# Patient Record
Sex: Female | Born: 1995 | Race: Black or African American | Hispanic: No | Marital: Single | Smoking: Never smoker
Health system: Southern US, Community
[De-identification: ages and names within clinical notes are randomized; demographics above are authoritative.]

## PROBLEM LIST (undated history)

## (undated) DIAGNOSIS — Z789 Other specified health status: Secondary | ICD-10-CM

---

## 2019-11-26 ENCOUNTER — Inpatient Hospital Stay (HOSPITAL_COMMUNITY): Payer: Medicaid Other

## 2019-11-26 ENCOUNTER — Encounter (HOSPITAL_COMMUNITY): Payer: Self-pay | Admitting: Obstetrics and Gynecology

## 2019-11-26 ENCOUNTER — Inpatient Hospital Stay (HOSPITAL_COMMUNITY)
Admission: AD | Admit: 2019-11-26 | Discharge: 2019-11-26 | Disposition: A | Discharge status: Home / Self Care | Payer: Medicaid Other | Attending: Obstetrics and Gynecology | Admitting: Obstetrics and Gynecology

## 2019-11-26 DIAGNOSIS — O34219 Maternal care for unspecified type scar from previous cesarean delivery: Secondary | ICD-10-CM | POA: Diagnosis not present

## 2019-11-26 DIAGNOSIS — Z3A09 9 weeks gestation of pregnancy: Secondary | ICD-10-CM | POA: Insufficient documentation

## 2019-11-26 DIAGNOSIS — O208 Other hemorrhage in early pregnancy: Secondary | ICD-10-CM | POA: Diagnosis not present

## 2019-11-26 DIAGNOSIS — R109 Unspecified abdominal pain: Secondary | ICD-10-CM

## 2019-11-26 DIAGNOSIS — O23591 Infection of other part of genital tract in pregnancy, first trimester: Secondary | ICD-10-CM | POA: Insufficient documentation

## 2019-11-26 DIAGNOSIS — O26891 Other specified pregnancy related conditions, first trimester: Secondary | ICD-10-CM

## 2019-11-26 DIAGNOSIS — B9689 Other specified bacterial agents as the cause of diseases classified elsewhere: Secondary | ICD-10-CM

## 2019-11-26 HISTORY — DX: Other specified health status: Z78.9

## 2019-11-26 LAB — WET PREP, GENITAL
Sperm: NONE SEEN
Trich, Wet Prep: NONE SEEN
Yeast Wet Prep HPF POC: NONE SEEN

## 2019-11-26 LAB — URINALYSIS, ROUTINE W REFLEX MICROSCOPIC
Bacteria, UA: NONE SEEN
Bilirubin Urine: NEGATIVE
Glucose, UA: NEGATIVE mg/dL
Hgb urine dipstick: NEGATIVE
Ketones, ur: NEGATIVE mg/dL
Leukocytes,Ua: NEGATIVE
Nitrite: NEGATIVE
Protein, ur: NEGATIVE mg/dL
Specific Gravity, Urine: 1.008 (ref 1.005–1.030)
pH: 7 (ref 5.0–8.0)

## 2019-11-26 LAB — I-STAT BETA HCG BLOOD, ED (MC, WL, AP ONLY): I-stat hCG, quantitative: >2000 m[IU]/mL — ABNORMAL HIGH (ref <5)

## 2019-11-26 LAB — CBC
HCT: 35.7 % — ABNORMAL LOW (ref 36.0–46.0)
Hemoglobin: 11.9 g/dL — ABNORMAL LOW (ref 12.0–15.0)
MCH: 30.4 pg (ref 26.0–34.0)
MCHC: 33.3 g/dL (ref 30.0–36.0)
MCV: 91.3 fL (ref 80.0–100.0)
Platelets: 201 10*3/uL (ref 150–400)
RBC: 3.91 MIL/uL (ref 3.87–5.11)
RDW: 14.1 % (ref 11.5–15.5)
WBC: 6.2 10*3/uL (ref 4.0–10.5)
nRBC: 0 % (ref 0.0–0.2)

## 2019-11-26 LAB — ABO/RH: ABO/RH(D): O POS

## 2019-11-26 LAB — HCG, QUANTITATIVE, PREGNANCY: hCG, Beta Chain, Quant, S: 106890 m[IU]/mL — ABNORMAL HIGH (ref <5)

## 2019-11-26 MED ORDER — METRONIDAZOLE 0.75 % VA GEL: 1.0000 | Freq: Every day | VAGINAL | 0 refills | Status: DC

## 2019-11-26 NOTE — MAU Provider Note (Signed)
History     CSN: [id number]  Arrival date and time: 11/26/19 1334   First Provider Initiated Contact with Patient 11/26/19 2034      Chief Complaint  Patient presents with  . Abdominal Pain  . Vaginal Bleeding   Denise Fleming is a 24 y.o. G3P2002 at Unknown [place] as she is unsure of her LMP.  She reports she was taking pills, but discontinued them in November.  Patient reports she was having periods after cessation of her pills.  She presents today for Abdominal Pain and Vaginal Bleeding.  She reports the abdominal pain is intermittent cramping.  She reports she has not taken anything for the pain and that it is mild.  Patient reports that she was having vaginal bleeding, but that was last week and has since subsided.    OB History    Gravida  3   Para  2   Term      Preterm      AB      Living  2     SAB      TAB      Ectopic      Multiple      Live Births           Obstetric Comments  1st baby "premature[place], [place] 2nd baby C/S[place]         Past Medical History:  Diagnosis Date  . Medical history non-contributory     Past Surgical History:  Procedure Laterality Date  . CESAREAN SECTION      No family history on file.  Social History   Tobacco Use  . Smoking status: Never Smoker  . Smokeless tobacco: Never Used  Substance Use Topics  . Alcohol use: Never  . Drug use: Never    Allergies: No Known Allergies  No medications prior to admission.    Review of Systems  Constitutional: Negative for chills and fever.  Respiratory: Negative for cough and shortness of breath.   Gastrointestinal: Positive for abdominal pain. Negative for constipation, diarrhea, nausea and vomiting.  Genitourinary: Positive for pelvic pain (Pressure) and vaginal bleeding (None currently). Negative for difficulty urinating, dysuria and vaginal discharge.  Musculoskeletal: Positive for back pain Lambert Mody).  Neurological: Positive for headaches  (Yesterday, but none currently). Negative for dizziness and light-headedness.   Physical Exam   Blood pressure 119/72, pulse 73, temperature 98.2 F (36.8 C), temperature source Oral, resp. rate 16, height 5\' 2"  (1.575 m), weight 72 kg, SpO2 100 %.  Physical Exam Exam conducted with a chaperone present.  Constitutional:      Appearance: She is well-developed.  HENT:     Head: Normocephalic and atraumatic.  Eyes:     Conjunctiva/sclera: Conjunctivae normal.  Cardiovascular:     Rate and Rhythm: Normal rate and regular rhythm.     Heart sounds: Normal heart sounds.  Pulmonary:     Effort: Pulmonary effort is normal. No respiratory distress.     Breath sounds: Normal breath sounds.  Abdominal:     General: Bowel sounds are normal. There is no distension.     Palpations: Abdomen is soft.     Tenderness: There is abdominal tenderness in the right lower quadrant, suprapubic area and left lower quadrant.  Genitourinary:    Vagina: Vaginal discharge present.     Cervix: No cervical motion tenderness, discharge or friability.     Uterus: Enlarged. Not tender.      Comments: Speculum Exam: -Normal External Genitalia: Non tender, no  apparent discharge at introitus.  -Vaginal Vault: Pink mucosa with good rugae. Small amt thin white discharge -wet prep collected -Cervix:Pink, no lesions, cysts, or polyps.  Appears closed. No active bleeding from os-GC/CT collected -Bimanual Exam:  Uterus enlarged, but difficult to distinguish size d/t position and panus.   Skin:    General: Skin is warm and dry.     Findings: Rash (Flesh colored plaques noted on back and legs. ) present.  Neurological:     Mental Status: She is alert and oriented to person, place, and time.  Psychiatric:        Mood and Affect: Mood normal.        Behavior: Behavior normal.     MAU Course  Procedures Results for orders placed or performed during the hospital encounter of 11/26/19 (from the past 24 hour(s))  I-Stat  Beta hCG blood, ED ([facility], [facility], [facility] only)     Status: Abnormal   Collection Time: 11/26/19  2:50 PM  Result Value Ref Range   I-stat hCG, quantitative >2,000.0 (H) <5 mIU/mL   Comment 3          Wet prep, genital     Status: Abnormal   Collection Time: 11/26/19  8:48 PM  Result Value Ref Range   Yeast Wet Prep HPF POC NONE SEEN NONE SEEN   Trich, Wet Prep NONE SEEN NONE SEEN   Clue Cells Wet Prep HPF POC PRESENT (A) NONE SEEN   WBC, Wet Prep HPF POC MANY (A) NONE SEEN   Sperm NONE SEEN   hCG, quantitative, pregnancy     Status: Abnormal   Collection Time: 11/26/19  8:52 PM  Result Value Ref Range   hCG, Beta Chain, Quant, S 106,890 (H) <5 mIU/mL  ABO/Rh     Status: None   Collection Time: 11/26/19  8:52 PM  Result Value Ref Range   ABO/RH(D) O POS    No rh immune globuloin      NOT A RH IMMUNE GLOBULIN CANDIDATE, PT RH POSITIVE Performed at [facility], [address] [place], [place] [address]   CBC     Status: Abnormal   Collection Time: 11/26/19  8:52 PM  Result Value Ref Range   WBC 6.2 4.0 - 10.5 K/uL   RBC 3.91 3.87 - 5.11 MIL/uL   Hemoglobin 11.9 (L) 12.0 - 15.0 g/dL   HCT 21.3 (L) 36 - 46 %   MCV 91.3 80.0 - 100.0 fL   MCH 30.4 26.0 - 34.0 pg   MCHC 33.3 30.0 - 36.0 g/dL   RDW 08.6 57.8 - 46.9 %   Platelets 201 150 - 400 K/uL   nRBC 0.0 0.0 - 0.2 %   US OB Comp Less 14 Wks  Result Date: 11/26/2019 CLINICAL DATA:  Abdominal pain, spotting EXAM: OBSTETRIC <14 WK ULTRASOUND TECHNIQUE: Transabdominal ultrasound was performed for evaluation of the gestation as well as the maternal uterus and adnexal regions. COMPARISON:  None. FINDINGS: Intrauterine gestational sac: Present, single Yolk sac:  Present, single, normal-appearing Embryo:  Present, single Cardiac Activity: Present, regular Heart Rate: 173 bpm MSD: Appropriate for fetal size CRL:   25.4 mm   9 w 1 d                  [place] EDC: 06/29/2020 Subchorionic hemorrhage: A small subchorionic hemorrhage is identified  comprising less than 25% of the chorionic surface. Maternal uterus/adnexae: The uterus is anteverted. No intrauterine masses are seen. The cervix  is closed though not optimally assessed on this examination due to transabdominal technique. No free fluid within the cul-de-sac. The maternal ovaries are normal in size and echogenicity. No adnexal masses are seen. IMPRESSION: Single living intrauterine gestation with an estimated gestational age of [redacted] weeks, 1 day. Small subchorionic hemorrhage. Electronically Signed   By: Helyn Numbers MD   On: 11/26/2019 21:38    MDM Pelvic Exam; Wet Prep and GC/CT Labs: UA, UPT, CBC, hCG, ABO Ultrasound Assessment and Plan  24 year old [id number] at Unknown GA Abdominal Pain Vaginal Bleeding-Subsided  -POC reviewed. -Exam performed and findings discussed. -Cultures collected and pending.  -Labs ordered -Patient offered and declines pain medication. -Will send for [place].   Cherre Robins 11/26/2019, 8:34 PM   Reassessment (10:18 PM) SIUP at 9.1 weeks Bacterial Vaginosis  -Reviewed [place] and lab findings. -Informed of GA of 9.1 weeks with EDC of 06/29/2020 -Discussed bacterial vagonisis including diagnosis and treatment. -Informed that due to abdominal pain would treat. -Patient verbalized understanding and without further q/c. -Instructed to establish [facility]. -Given written script for metronidazole gel.  -Encouraged to call or return to MAU if symptoms worsen or with the onset of new symptoms. -Discharged to home in stable condition.  Cherre Robins MSN, CNM Advanced Practice Provider, Center for [employer]

## 2019-11-26 NOTE — MAU Note (Signed)
Pt reports to MAU stating last week she noticed some spotting when she went to the [facility]. Pt states no bleeding this week. Pt reports she is having some back pain and lower abdominal pain. Pt is unsure of LMP.

## 2019-11-26 NOTE — ED Triage Notes (Signed)
Pt arrives POV for eval of vag bleeding, abd pain and +preg test. Pt reports LMP mid may. Pos preg test last week, has not f/u w/ provider yet.

## 2019-11-26 NOTE — Discharge Instructions (Signed)
Bacterial Vaginosis  Bacterial vaginosis is a vaginal infection that occurs when the normal balance of bacteria in the vagina is disrupted. It results from an overgrowth of certain bacteria. This is the most common vaginal infection among women ages 15-44. Because bacterial vaginosis increases your risk for STIs (sexually transmitted infections), getting treated can help reduce your risk for chlamydia, gonorrhea, herpes, and HIV (human immunodeficiency virus). Treatment is also important for preventing complications in pregnant women, because this condition can cause an early (premature) delivery. What are the causes? This condition is caused by an increase in harmful bacteria that are normally present in small amounts in the vagina. However, the reason that the condition develops is not fully understood. What increases the risk? The following factors may make you more likely to develop this condition:  Having a new sexual partner or multiple sexual partners.  Having unprotected sex.  Douching.  Having an intrauterine device (IUD).  Smoking.  Drug and alcohol abuse.  Taking certain antibiotic medicines.  Being pregnant. You cannot get bacterial vaginosis from toilet seats, bedding, swimming pools, or contact with objects around you. What are the signs or symptoms? Symptoms of this condition include:  Grey or white vaginal discharge. The discharge can also be watery or foamy.  A fish-like odor with discharge, especially after sexual intercourse or during menstruation.  Itching in and around the vagina.  Burning or pain with urination. Some women with bacterial vaginosis have no signs or symptoms. How is this diagnosed? This condition is diagnosed based on:  Your medical history.  A physical exam of the vagina.  Testing a sample of vaginal fluid under a microscope to look for a large amount of bad bacteria or abnormal cells. Your health care provider may use a cotton swab or  a small wooden spatula to collect the sample. How is this treated? This condition is treated with antibiotics. These may be given as a pill, a vaginal cream, or a medicine that is put into the vagina (suppository). If the condition comes back after treatment, a second round of antibiotics may be needed. Follow these instructions at home: Medicines  Take over-the-counter and prescription medicines only as told by your health care provider.  Take or use your antibiotic as told by your health care provider. Do not stop taking or using the antibiotic even if you start to feel better. General instructions  If you have a female sexual partner, tell her that you have a vaginal infection. She should see her health care provider and be treated if she has symptoms. If you have a female sexual partner, he does not need treatment.  During treatment: ? Avoid sexual activity until you finish treatment. ? Do not douche. ? Avoid alcohol as directed by your health care provider. ? Avoid breastfeeding as directed by your health care provider.  Drink enough water and fluids to keep your urine clear or pale yellow.  Keep the area around your vagina and rectum clean. ? Wash the area daily with warm water. ? Wipe yourself from front to back after using the toilet.  Keep all follow-up visits as told by your health care provider. This is important. How is this prevented?  Do not douche.  Wash the outside of your vagina with warm water only.  Use protection when having sex. This includes latex condoms and dental dams.  Limit how many sexual partners you have. To help prevent bacterial vaginosis, it is best to have sex with just one partner (  monogamous).  Make sure you and your sexual partner are tested for STIs.  Wear cotton or cotton-lined underwear.  Avoid wearing tight pants and pantyhose, especially during summer.  Limit the amount of alcohol that you drink.  Do not use any products that contain  nicotine or tobacco, such as cigarettes and e-cigarettes. If you need help quitting, ask your health care provider.  Do not use illegal drugs. Where to find more information  Centers for Disease Control and Prevention: SolutionApps.co.za  American Sexual Health Association (ASHA): www.ashastd.org  U.S. Department of Health and Health and safety inspector, Office on Women's Health: ConventionalMedicines.si or http://www.anderson-williamson.info/ Contact a health care provider if:  Your symptoms do not improve, even after treatment.  You have more discharge or pain when urinating.  You have a fever.  You have pain in your abdomen.  You have pain during sex.  You have vaginal bleeding between periods. Summary  Bacterial vaginosis is a vaginal infection that occurs when the normal balance of bacteria in the vagina is disrupted.  Because bacterial vaginosis increases your risk for STIs (sexually transmitted infections), getting treated can help reduce your risk for chlamydia, gonorrhea, herpes, and HIV (human immunodeficiency virus). Treatment is also important for preventing complications in pregnant women, because the condition can cause an early (premature) delivery.  This condition is treated with antibiotic medicines. These may be given as a pill, a vaginal cream, or a medicine that is put into the vagina (suppository). This information is not intended to replace advice given to you by your health care provider. Make sure you discuss any questions you have with your health care provider. Document Revised: 03/14/2017 Document Reviewed: 12/16/2015 Elsevier Patient Education  2020 Elsevier Inc.  [facility] Prenatal Care Providers   Center for [employer] [facility] at [facility]       Phone: [phone]  Center for [facility] at [place] Phone: [phone]  Center for [employer] at [place]  Phone: [phone]  Center for [facility]  Healthcare at [facility]  Phone: [phone]  Center for [facility] at [place]  Phone: [phone]  [facility]       Phone: [phone]  [facility]    Phone: [phone]   [facility] [place])    Phone: [phone]  [facility]    Phone: [phone]  [facility]    Phone: [phone]   [facility]  Phone: [phone]  [facility]    Phone: [phone]  [facility]   Phone: [phone]  [facility]    Phone: [phone]

## 2019-11-26 NOTE — ED Provider Notes (Signed)
Patient placed in Quick Look pathway, seen and evaluated   Chief Complaint: back pain, spotting  HPI:   Patient G1P0 here 2 with back pain and spotting.  ROS: back pain (one)  Physical Exam:   Gen: No distress  Neuro: Awake and Alert  Skin: Warm    Focused Exam: Abd soft   Initiation of care has begun. The patient has been counseled on the process, plan, and necessity for staying for the completion/evaluation, and the remainder of the medical screening examination    Delos Haring 11/26/19 2041    Cathren Laine, MD 12/01/19 1436

## 2019-11-29 LAB — GC/CHLAMYDIA PROBE AMP (~~LOC~~) NOT AT ARMC
Chlamydia: NEGATIVE
Comment: NEGATIVE
Comment: NORMAL
Neisseria Gonorrhea: NEGATIVE

## 2020-02-28 ENCOUNTER — Other Ambulatory Visit: Payer: Self-pay

## 2020-02-28 ENCOUNTER — Encounter: Payer: Self-pay | Admitting: General Practice

## 2020-02-28 ENCOUNTER — Ambulatory Visit (INDEPENDENT_AMBULATORY_CARE_PROVIDER_SITE_OTHER): Payer: Medicaid Other | Admitting: *Deleted

## 2020-02-28 VITALS — BP 98/54 | HR 83 | Temp 97.9°F | Ht 62.0 in | Wt 202.4 lb

## 2020-02-28 DIAGNOSIS — Z3480 Encounter for supervision of other normal pregnancy, unspecified trimester: Secondary | ICD-10-CM

## 2020-02-28 DIAGNOSIS — O0930 Supervision of pregnancy with insufficient antenatal care, unspecified trimester: Secondary | ICD-10-CM

## 2020-02-28 MED ORDER — PRENATAL 27-1 MG PO TABS: 1.0000 | ORAL_TABLET | Freq: Every day | ORAL | 12 refills | Status: DC

## 2020-02-28 NOTE — Patient Instructions (Addendum)
Genetic Screening Results Information: You are having genetic testing called [facility] today.  It will take approximately 2 weeks before the results are available.  To get your results, you need Internet access to a web browser to search [facility]/MyChart (the direct app on your phone will not give you these results).  Then select Lab Scanned and click on the blue hyper link that says View Image to see your [facility] results.  You can also use the directions on the purple card given to look up your results directly on the [facility] website.   Warning Signs During Pregnancy A pregnancy lasts about 40 weeks, starting from the first day of your last period until the baby is born. Pregnancy is divided into three phases called trimesters.  The first trimester refers to week 1 through week 13 of pregnancy.  The second trimester is the start of week 14 through the end of week 27.  The third trimester is the start of week 28 until you deliver your baby. During each trimester of pregnancy, certain signs and symptoms may indicate a problem. Talk with your health care provider about your current health and any medical conditions you have. Make sure you know the symptoms that you should watch for and report. How does this affect me?  Warning signs in the first trimester While some changes during the first trimester may be uncomfortable, most do not represent a serious problem. Let your health care provider know if you have any of the following warning signs in the first trimester:  You cannot eat or drink without vomiting, and this lasts for longer than a day.  You have vaginal bleeding or spotting along with menstrual-like cramping.  You have diarrhea for longer than a day.  You have a fever or other signs of infection, such as: ? Pain or burning when you urinate. ? Foul smelling or thick or yellowish vaginal discharge. Warning signs in the second trimester As your baby grows and changes during  the second trimester, there are additional signs and symptoms that may indicate a problem. These include:  Signs and symptoms of infection, including a fever.  Signs or symptoms of a miscarriage or preterm labor, such as regular contractions, menstrual-like cramping, or lower abdominal pain.  Bloody or watery vaginal discharge or obvious vaginal bleeding.  Feeling like your heart is pounding.  Having trouble breathing.  Nausea, vomiting, or diarrhea that lasts for longer than a day.  Craving non-food items, such as clay, chalk, or dirt. This may be a sign of a very treatable medical condition called pica. Later in your second trimester, watch for signs and symptoms of a serious medical condition called preeclampsia.These include:  Changes in your vision.  A severe headache that does not go away.  Nausea and vomiting. It is also important to notice if your baby stops moving or moves less than usual during this time. Warning signs in the third trimester As you approach the third trimester, your baby is growing and your body is preparing for the birth of your baby. In your third trimester, be sure to let your health care provider know if:  You have signs and symptoms of infection, including a fever.  You have vaginal bleeding.  You notice that your baby is moving less than usual or is not moving.  You have nausea, vomiting, or diarrhea that lasts for longer than a day.  You have a severe headache that does not go away.  You have vision  changes, including seeing spots or having blurry or double vision.  You have increased swelling in your hands or face. How does this affect my baby? Throughout your pregnancy, always report any of the warning signs of a problem to your health care provider. This can help prevent complications that may affect your baby, including:  Increased risk for premature birth.  Infection that may be transmitted to your baby.  Increased risk for  stillbirth. Contact a health care provider if:  You have any of the warning signs of a problem for the current trimester of your pregnancy.  Any of the following apply to you during any trimester of pregnancy: ? You have strong emotions, such as sadness or anxiety, that interfere with work or personal relationships. ? You feel unsafe in your home and need help finding a safe place to live. ? You are using tobacco products, alcohol, or drugs and you need help to stop. Get help right away if: You have signs or symptoms of labor before 37 weeks of pregnancy. These include:  Contractions that are 5 minutes or less apart, or that increase in frequency, intensity, or length.  Sudden, sharp abdominal pain or low back pain.  Uncontrolled gush or trickle of fluid from your vagina. Summary  A pregnancy lasts about 40 weeks, starting from the first day of your last period until the baby is born. Pregnancy is divided into three phases called trimesters. Each trimester has warning signs to watch for.  Always report any warning signs to your health care provider in order to prevent complications that may affect both you and your baby.  Talk with your health care provider about your current health and any medical conditions you have. Make sure you know the symptoms that you should watch for and report. This information is not intended to replace advice given to you by your health care provider. Make sure you discuss any questions you have with your health care provider. Document Revised: 07/21/2018 Document Reviewed: 01/16/2017 Elsevier Patient Education  2020 [employer].  Second Trimester of Pregnancy  The second trimester is from week 14 through week 27 (month 4 through 6). This is often the time in pregnancy that you feel your best. Often times, morning sickness has lessened or quit. You may have more energy, and you may get hungry more often. Your unborn baby is growing rapidly. At the end of  the sixth month, he or she is about 9 inches long and weighs about 1 pounds. You will likely feel the baby move between 18 and 20 weeks of pregnancy. Follow these instructions at home: Medicines  Take over-the-counter and prescription medicines only as told by your doctor. Some medicines are safe and some medicines are not safe during pregnancy.  Take a prenatal vitamin that contains at least 600 micrograms (mcg) of folic acid.  If you have trouble pooping (constipation), take medicine that will make your stool soft (stool softener) if your doctor approves. Eating and drinking   Eat regular, healthy meals.  Avoid raw meat and uncooked cheese.  If you get low calcium from the food you eat, talk to your doctor about taking a daily calcium supplement.  Avoid foods that are high in fat and sugars, such as fried and sweet foods.  If you feel sick to your stomach (nauseous) or throw up (vomit): ? Eat 4 or 5 small meals a day instead of 3 large meals. ? Try eating a few soda crackers. ? Drink liquids between  meals instead of during meals.  To prevent constipation: ? Eat foods that are high in fiber, like fresh fruits and vegetables, whole grains, and beans. ? Drink enough fluids to keep your pee (urine) clear or pale yellow. Activity  Exercise only as told by your doctor. Stop exercising if you start to have cramps.  Do not exercise if it is too hot, too humid, or if you are in a place of great height (high altitude).  Avoid heavy lifting.  Wear low-heeled shoes. Sit and stand up straight.  You can continue to have sex unless your doctor tells you not to. Relieving pain and discomfort  Wear a good support bra if your breasts are tender.  Take warm water baths (sitz baths) to soothe pain or discomfort caused by hemorrhoids. Use hemorrhoid cream if your doctor approves.  Rest with your legs raised if you have leg cramps or low back pain.  If you develop puffy, bulging veins  (varicose veins) in your legs: ? Wear support hose or compression stockings as told by your doctor. ? Raise (elevate) your feet for 15 minutes, 3-4 times a day. ? Limit salt in your food. Prenatal care  Write down your questions. Take them to your prenatal visits.  Keep all your prenatal visits as told by your doctor. This is important. Safety  Wear your seat belt when driving.  Make a list of emergency phone numbers, including numbers for family, friends, the hospital, and police and fire departments. General instructions  Ask your doctor about the right foods to eat or for help finding a counselor, if you need these services.  Ask your doctor about local prenatal classes. Begin classes before month 6 of your pregnancy.  Do not use hot tubs, steam rooms, or saunas.  Do not douche or use tampons or scented sanitary pads.  Do not cross your legs for long periods of time.  Visit your dentist if you have not done so. Use a soft toothbrush to brush your teeth. Floss gently.  Avoid all smoking, herbs, and alcohol. Avoid drugs that are not approved by your doctor.  Do not use any products that contain nicotine or tobacco, such as cigarettes and e-cigarettes. If you need help quitting, ask your doctor.  Avoid cat litter boxes and soil used by cats. These carry germs that can cause birth defects in the baby and can cause a loss of your baby (miscarriage) or stillbirth. Contact a doctor if:  You have mild cramps or pressure in your lower belly.  You have pain when you pee (urinate).  You have bad smelling fluid coming from your vagina.  You continue to feel sick to your stomach (nauseous), throw up (vomit), or have watery poop (diarrhea).  You have a nagging pain in your belly area.  You feel dizzy. Get help right away if:  You have a fever.  You are leaking fluid from your vagina.  You have spotting or bleeding from your vagina.  You have severe belly cramping or  pain.  You lose or gain weight rapidly.  You have trouble catching your breath and have chest pain.  You notice sudden or extreme puffiness (swelling) of your face, hands, ankles, feet, or legs.  You have not felt the baby move in over an hour.  You have severe headaches that do not go away when you take medicine.  You have trouble seeing. Summary  The second trimester is from week 14 through week 27 (months 4 through  6). This is often the time in pregnancy that you feel your best.  To take care of yourself and your unborn baby, you will need to eat healthy meals, take medicines only if your doctor tells you to do so, and do activities that are safe for you and your baby.  Call your doctor if you get sick or if you notice anything unusual about your pregnancy. Also, call your doctor if you need help with the right food to eat, or if you want to know what activities are safe for you. This information is not intended to replace advice given to you by your health care provider. Make sure you discuss any questions you have with your health care provider. Document Revised: 07/24/2018 Document Reviewed: 05/07/2016 Elsevier Patient Education  [place].

## 2020-02-28 NOTE — Progress Notes (Addendum)
° °  Location: [facility] Patient: Denise Fleming Provider: Clovis Pu, RN   PRENATAL INTAKE SUMMARY  Denise Fleming presents today New OB Nurse Interview.  OB History     Gravida  5   Para  4   Term  2   Preterm  1   AB      Living  4      SAB      TAB      Ectopic      Multiple      Live Births  4        Obstetric Comments  1st baby "premature[place], [place] 2nd baby C/[place]         I have reviewed the patient's medical, obstetrical, social, and family histories, medications, and available lab results.  SUBJECTIVE She complains of body itching. Reported the itching occurs with previous pregnancies. Previous c-section due to weight of baby. Pregnancy #1: Preterm; unsure of reason. All previous pregnancies were delivered in [place].  OBJECTIVE Initial Nurse interview for history/labs (New OB). Late to Care.  EDD: 06/29/2020 by early ultrasound GA: [redacted]w[redacted]d [id number] FHT: 142  GENERAL APPEARANCE: alert, well appearing, in no apparent distress, oriented to person, place and time  ASSESSMENT Normal pregnancy  PLAN Prenatal care:  [facility] Pnl/HIV/Hep C OB Urine Culture GC/CT/PAP at next visit with Gerrit Heck, CNM 03/22/20 HgbEval/SMA/CF ([facility]) [facility] A1C AFP Ultrasound MFM complete +14 weeks ordered Bile acids ordered due to itching Rx for PNV sent to pharmacy  Follow Up Instructions:   I discussed the assessment and treatment plan with the patient. The patient was provided an opportunity to ask questions and all were answered. The patient agreed with the plan and demonstrated an understanding of the instructions.   The patient was advised to call back or seek an in-person evaluation if the symptoms worsen or if the condition fails to improve as anticipated.  I provided 30 minutes of  face-to-face time during this encounter.  Clovis Pu, RN   Chart reviewed for nurse visit. Agree with plan of care.    Currie Paris, NP 02/28/2020 6:12 PM

## 2020-02-29 LAB — AB SCR+ANTIBODY ID

## 2020-03-01 LAB — CBC/D/PLT+RPR+RH+ABO+RUB AB...
Basophils Absolute: 0 10*3/uL (ref 0.0–0.2)
Basos: 0 %
EOS (ABSOLUTE): 0.4 10*3/uL (ref 0.0–0.4)
Eos: 7 %
HCV Ab: 0.1 s/co ratio (ref 0.0–0.9)
HIV Screen 4th Generation wRfx: NONREACTIVE
Hematocrit: 31.2 % — ABNORMAL LOW (ref 34.0–46.6)
Hemoglobin: 10.9 g/dL — ABNORMAL LOW (ref 11.1–15.9)
Hepatitis B Surface Ag: NEGATIVE
Immature Grans (Abs): 0 10*3/uL (ref 0.0–0.1)
Immature Granulocytes: 1 %
Lymphocytes Absolute: 1.7 10*3/uL (ref 0.7–3.1)
Lymphs: 26 %
MCH: 33.3 pg — ABNORMAL HIGH (ref 26.6–33.0)
MCHC: 34.9 g/dL (ref 31.5–35.7)
MCV: 95 fL (ref 79–97)
Monocytes Absolute: 0.6 10*3/uL (ref 0.1–0.9)
Monocytes: 9 %
Neutrophils Absolute: 3.8 10*3/uL (ref 1.4–7.0)
Neutrophils: 57 %
Platelets: 168 10*3/uL (ref 150–450)
RBC: 3.27 x10E6/uL — ABNORMAL LOW (ref 3.77–5.28)
RDW: 12.6 % (ref 11.7–15.4)
RPR Ser Ql: NONREACTIVE
Rh Factor: POSITIVE
Rubella Antibodies, IGG: 6.09 index (ref >0.99)
WBC: 6.5 10*3/uL (ref 3.4–10.8)

## 2020-03-01 LAB — COMPREHENSIVE METABOLIC PANEL
ALT: 8 IU/L (ref 0–32)
AST: 12 IU/L (ref 0–40)
Albumin/Globulin Ratio: 1.1 — ABNORMAL LOW (ref 1.2–2.2)
Albumin: 3.6 g/dL — ABNORMAL LOW (ref 3.9–5.0)
Alkaline Phosphatase: 86 IU/L (ref 44–121)
BUN/Creatinine Ratio: 6 — ABNORMAL LOW (ref 9–23)
BUN: 3 mg/dL — ABNORMAL LOW (ref 6–20)
Bilirubin Total: 0.2 mg/dL (ref 0.0–1.2)
CO2: 20 mmol/L (ref 20–29)
Calcium: 8.5 mg/dL — ABNORMAL LOW (ref 8.7–10.2)
Chloride: 104 mmol/L (ref 96–106)
Creatinine, Ser: 0.54 mg/dL — ABNORMAL LOW (ref 0.57–1.00)
GFR calc Af Amer: 152 mL/min/{1.73_m2} (ref >59)
GFR calc non Af Amer: 132 mL/min/{1.73_m2} (ref >59)
Globulin, Total: 3.2 g/dL (ref 1.5–4.5)
Glucose: 81 mg/dL (ref 65–99)
Potassium: 3.5 mmol/L (ref 3.5–5.2)
Sodium: 135 mmol/L (ref 134–144)
Total Protein: 6.8 g/dL (ref 6.0–8.5)

## 2020-03-01 LAB — URINE CULTURE, OB REFLEX

## 2020-03-01 LAB — AFP, SERUM, OPEN SPINA BIFIDA
AFP MoM: 1.25
AFP Value: 85.5 ng/mL
Gest. Age on Collection Date: 22 weeks
Maternal Age At EDD: 26 yr
OSBR Risk 1 IN: 10000
Test Results:: NEGATIVE
Weight: 202 [lb_av]

## 2020-03-01 LAB — HEMOGLOBIN A1C
Est. average glucose Bld gHb Est-mCnc: 94 mg/dL
Hgb A1c MFr Bld: 4.9 % (ref 4.8–5.6)

## 2020-03-01 LAB — BILE ACIDS, TOTAL: Bile Acids Total: 7.1 umol/L (ref 0.0–10.0)

## 2020-03-01 LAB — AB SCR+ANTIBODY ID: Antibody Screen: POSITIVE — AB

## 2020-03-01 LAB — HCV INTERPRETATION

## 2020-03-01 LAB — CULTURE, OB URINE

## 2020-03-14 ENCOUNTER — Encounter: Payer: Self-pay | Admitting: General Practice

## 2020-03-17 ENCOUNTER — Ambulatory Visit: Payer: Medicaid Other

## 2020-03-17 ENCOUNTER — Other Ambulatory Visit: Payer: Self-pay | Admitting: *Deleted

## 2020-03-17 ENCOUNTER — Other Ambulatory Visit: Payer: Self-pay

## 2020-03-17 DIAGNOSIS — Z3A25 25 weeks gestation of pregnancy: Secondary | ICD-10-CM

## 2020-03-17 DIAGNOSIS — Z363 Encounter for antenatal screening for malformations: Secondary | ICD-10-CM | POA: Diagnosis not present

## 2020-03-17 DIAGNOSIS — O0932 Supervision of pregnancy with insufficient antenatal care, second trimester: Secondary | ICD-10-CM

## 2020-03-17 DIAGNOSIS — O0930 Supervision of pregnancy with insufficient antenatal care, unspecified trimester: Secondary | ICD-10-CM

## 2020-03-17 DIAGNOSIS — O09212 Supervision of pregnancy with history of pre-term labor, second trimester: Secondary | ICD-10-CM

## 2020-03-17 DIAGNOSIS — Z3480 Encounter for supervision of other normal pregnancy, unspecified trimester: Secondary | ICD-10-CM

## 2020-03-18 DIAGNOSIS — R768 Other specified abnormal immunological findings in serum: Secondary | ICD-10-CM | POA: Insufficient documentation

## 2020-03-18 DIAGNOSIS — R7689 Other specified abnormal immunological findings in serum: Secondary | ICD-10-CM | POA: Insufficient documentation

## 2020-03-22 ENCOUNTER — Other Ambulatory Visit: Payer: Self-pay

## 2020-03-22 ENCOUNTER — Other Ambulatory Visit (HOSPITAL_COMMUNITY)
Admission: RE | Admit: 2020-03-22 | Discharge: 2020-03-22 | Disposition: A | Discharge status: Home / Self Care | Payer: Medicaid Other | Source: Ambulatory Visit

## 2020-03-22 ENCOUNTER — Ambulatory Visit (INDEPENDENT_AMBULATORY_CARE_PROVIDER_SITE_OTHER): Payer: Medicaid Other

## 2020-03-22 VITALS — BP 107/68 | HR 97 | Temp 98.1°F | Wt 207.2 lb

## 2020-03-22 DIAGNOSIS — Z3480 Encounter for supervision of other normal pregnancy, unspecified trimester: Secondary | ICD-10-CM

## 2020-03-22 DIAGNOSIS — Z124 Encounter for screening for malignant neoplasm of cervix: Secondary | ICD-10-CM | POA: Insufficient documentation

## 2020-03-22 DIAGNOSIS — B9689 Other specified bacterial agents as the cause of diseases classified elsewhere: Secondary | ICD-10-CM

## 2020-03-22 DIAGNOSIS — Z23 Encounter for immunization: Secondary | ICD-10-CM | POA: Diagnosis not present

## 2020-03-22 DIAGNOSIS — N760 Acute vaginitis: Secondary | ICD-10-CM

## 2020-03-22 DIAGNOSIS — E049 Nontoxic goiter, unspecified: Secondary | ICD-10-CM | POA: Insufficient documentation

## 2020-03-22 DIAGNOSIS — O99210 Obesity complicating pregnancy, unspecified trimester: Secondary | ICD-10-CM

## 2020-03-22 DIAGNOSIS — Z6791 Unspecified blood type, Rh negative: Secondary | ICD-10-CM | POA: Insufficient documentation

## 2020-03-22 DIAGNOSIS — Z3A25 25 weeks gestation of pregnancy: Secondary | ICD-10-CM | POA: Insufficient documentation

## 2020-03-22 DIAGNOSIS — O26892 Other specified pregnancy related conditions, second trimester: Secondary | ICD-10-CM | POA: Diagnosis not present

## 2020-03-22 DIAGNOSIS — Z98891 History of uterine scar from previous surgery: Secondary | ICD-10-CM

## 2020-03-22 DIAGNOSIS — R768 Other specified abnormal immunological findings in serum: Secondary | ICD-10-CM

## 2020-03-22 LAB — ANTIBODY SCREEN: Antibody Screen: NEGATIVE

## 2020-03-22 MED ORDER — METRONIDAZOLE 0.75 % VA GEL: 1.0000 | Freq: Every day | VAGINAL | 0 refills | Status: DC

## 2020-03-22 MED ORDER — ASPIRIN EC 81 MG PO TBEC
81.0000 mg | DELAYED_RELEASE_TABLET | Freq: Every day | ORAL | 2 refills | Status: DC
Start: 2020-03-22 — End: 2020-06-25

## 2020-03-22 NOTE — Patient Instructions (Signed)
Glucose Tolerance Test During Pregnancy Why am I having this test? The glucose tolerance test (GTT) is done to check how your body processes sugar (glucose). This is one of several tests used to diagnose diabetes that develops during pregnancy (gestational diabetes mellitus). Gestational diabetes is a temporary form of diabetes that some women develop during pregnancy. It usually occurs during the second trimester of pregnancy and goes away after delivery. Testing (screening) for gestational diabetes usually occurs between 24 and 28 weeks of pregnancy. You may have the GTT test after having a 1-hour glucose screening test if the results from that test indicate that you may have gestational diabetes. You may also have this test if:  You have a history of gestational diabetes.  You have a history of giving birth to very large babies or have experienced repeated fetal loss (stillbirth).  You have signs and symptoms of diabetes, such as: ? Changes in your vision. ? Tingling or numbness in your hands or feet. ? Changes in hunger, thirst, and urination that are not otherwise explained by your pregnancy. What is being tested? This test measures the amount of glucose in your blood at different times during a period of 3 hours. This indicates how well your body is able to process glucose. What kind of sample is taken?  Blood samples are required for this test. They are usually collected by inserting a needle into a blood vessel. How do I prepare for this test?  For 3 days before your test, eat normally. Have plenty of carbohydrate-rich foods.  Follow instructions from your health care provider about: ? Eating or drinking restrictions on the day of the test. You may be asked to not eat or drink anything other than water (fast) starting 8-10 hours before the test. ? Changing or stopping your regular medicines. Some medicines may interfere with this test. Tell a health care provider about:  All  medicines you are taking, including vitamins, herbs, eye drops, creams, and over-the-counter medicines.  Any blood disorders you have.  Any surgeries you have had.  Any medical conditions you have. What happens during the test? First, your blood glucose will be measured. This is referred to as your fasting blood glucose, since you fasted before the test. Then, you will drink a glucose solution that contains a certain amount of glucose. Your blood glucose will be measured again 1, 2, and 3 hours after drinking the solution. This test takes about 3 hours to complete. You will need to stay at the testing location during this time. During the testing period:  Do not eat or drink anything other than the glucose solution.  Do not exercise.  Do not use any products that contain nicotine or tobacco, such as cigarettes and e-cigarettes. If you need help stopping, ask your health care provider. The testing procedure may vary among health care providers and hospitals. How are the results reported? Your results will be reported as milligrams of glucose per deciliter of blood (mg/dL) or millimoles per liter (mmol/L). Your health care provider will compare your results to normal ranges that were established after testing a large group of people (reference ranges). Reference ranges may vary among labs and hospitals. For this test, common reference ranges are:  Fasting: less than 95-105 mg/dL (5.3-5.8 mmol/L).  1 hour after drinking glucose: less than 180-190 mg/dL (10.0-10.5 mmol/L).  2 hours after drinking glucose: less than 155-165 mg/dL (8.6-9.2 mmol/L).  3 hours after drinking glucose: 140-145 mg/dL (7.8-8.1 mmol/L). What do the   results mean? Results within reference ranges are considered normal, meaning that your glucose levels are well-controlled. If two or more of your blood glucose levels are high, you may be diagnosed with gestational diabetes. If only one level is high, your health care  provider may suggest repeat testing or other tests to confirm a diagnosis. Talk with your health care provider about what your results mean. Questions to ask your health care provider Ask your health care provider, or the department that is doing the test:  When will my results be ready?  How will I get my results?  What are my treatment options?  What other tests do I need?  What are my next steps? Summary  The glucose tolerance test (GTT) is one of several tests used to diagnose diabetes that develops during pregnancy (gestational diabetes mellitus). Gestational diabetes is a temporary form of diabetes that some women develop during pregnancy.  You may have the GTT test after having a 1-hour glucose screening test if the results from that test indicate that you may have gestational diabetes. You may also have this test if you have any symptoms or risk factors for gestational diabetes.  Talk with your health care provider about what your results mean. This information is not intended to replace advice given to you by your health care provider. Make sure you discuss any questions you have with your health care provider. Document Revised: 07/23/2018 Document Reviewed: 11/11/2016 Elsevier Patient Education  2020 [employer]  

## 2020-03-22 NOTE — Progress Notes (Signed)
Subjective:   Denise Fleming is a 24 y.o. G5P2104 at [redacted]w[redacted]d by 9.1 weeks early ultrasound being seen today for her first obstetrical visit.  Patient reports this was an unplanned pregnancy, but it is welcomed. Patient reports she had her previous children in [place] and recently moved to the [place] 5 months ago.   Gynecological/Obstetrical History: Her obstetrical history is significant for history of c/s. Patient does intend to breast feed. Pregnancy history fully reviewed. Patient reports abdominal cramping.  Patient reports her symptoms are present when she is very active, but can also come when sitting.  She states it doesn't last long and subsides without intervention.  Sexual Activity and Vaginal Concerns: Patient denies vaginal bleeding, leaking, or discharge.  She denies recent sexual activity.    Medical History/ROS:Patient denies significant medical history.  She also denies issues with urination, constipation, diarrhea, nausea, or vomiting.   Social History: Patient denies history or current usage of tobacco, alcohol, or drugs.  Patient reports the FOB is in [place] who is not involved.  Patient states she does not have money to bring him here.  Patient reports that she lives with her mother, 2 brothers and 1 sisters, and her 1st and 2nd son.  She states her other 2 children are back in [place] with her husband. Patient endorses safety at home and denies DV/A. Patient is not currently employed .  HISTORY: OB History  Gravida Para Term Preterm AB Living  5 4 2 1  0 4  SAB TAB Ectopic Multiple Live Births  0 0 0 0 4    # Outcome Date GA Lbr Len/2nd Weight Sex Delivery Anes PTL Lv  5 Current           4 Para 08/11/17   8 lb 6 oz (3.799 kg) F Vag-Spont  N LIV     Complications: Shoulder Dystocia  3 Term 08/22/13   9 lb 7 oz (4.281 kg) M CS-LTranv   LIV  2 Term 08/30/09   7 lb 7 oz (3.374 kg) M Vag-Spont   LIV  1 Preterm 09/06/06    M Vag-Spont   LIV    Obstetric Comments  1st  baby "premature"-[place], 09/08/06  2nd baby C/S-[place]    Last pap smear was done today and was pending  Past Medical History:  Diagnosis Date  . Medical history non-contributory    Past Surgical History:  Procedure Laterality Date  . CESAREAN SECTION     No family history on file. Social History   Tobacco Use  . Smoking status: Never Smoker  . Smokeless tobacco: Never Used  Vaping Use  . Vaping Use: Never used  Substance Use Topics  . Alcohol use: Never  . Drug use: Never   No Known Allergies Current Outpatient Medications on File Prior to Visit  Medication Sig Dispense Refill  . Prenatal 27-1 MG TABS Take 1 tablet by mouth daily. 30 tablet 12   No current facility-administered medications on file prior to visit.    Review of Systems Pertinent items noted in HPI and remainder of comprehensive ROS otherwise negative.  Exam   Vitals:   03/22/20 0909  BP: 107/68  Pulse: 97  Temp: 98.1 F (36.7 C)  Weight: 207 lb 3.2 oz (94 kg)   Fetal Heart Rate (bpm): 143  Physical Exam Constitutional:      Appearance: Normal appearance.  HENT:     Head: Normocephalic and atraumatic.  Eyes:     Conjunctiva/sclera:  Conjunctivae normal.  Neck:     Thyroid: Thyromegaly present. No thyroid mass or thyroid tenderness.     Trachea: Trachea normal.     Comments: Right side thyroid enlargement noted on exam Cardiovascular:     Rate and Rhythm: Normal rate and regular rhythm.     Heart sounds: Normal heart sounds.  Pulmonary:     Effort: Pulmonary effort is normal. No respiratory distress.     Breath sounds: Normal breath sounds.  Chest:     Breasts:        Right: No mass, nipple discharge, skin change or tenderness.        Left: No mass, nipple discharge, skin change or tenderness.     Comments: CBE completed Abdominal:     General: Bowel sounds are normal.     Tenderness: There is no abdominal tenderness.     Comments: Gravid, FH 26  Musculoskeletal:      Cervical back: Normal range of motion.  Neurological:     Mental Status: She is alert and oriented to person, place, and time.  Psychiatric:        Mood and Affect: Mood normal.        Behavior: Behavior normal.        Thought Content: Thought content normal.     Assessment:   24 y.o. year old G4W1027 Patient Active Problem List   Diagnosis Date Noted  . Red blood cell antibody positive 03/18/2020  . Supervision of other normal pregnancy, antepartum 02/28/2020  . Late prenatal care affecting pregnancy, antepartum 02/28/2020     Plan:  1. Supervision of other normal pregnancy, antepartum -Congratulations given and patient welcomed to practice. -Anticipatory guidance for prenatal visits including labs, ultrasounds, and testing; Initial labs reviewed -Genetic Screening Reviewed -Encouraged to complete MyChart Registration for her ability to review results, send requests, and have questions addressed.  -Discussed estimated due date of June 29, 2020. -Ultrasound discussed; fetal anatomic survey: results reviewed. -Initiate prenatal vitamins; Rx sent to pharmacy on file.  -Influenza offered and accepted. -Encouraged to seek out care at office or emergency room for urgent and/or emergent concerns. -Educated on the nature of [facility] with multiple MDs and other Advanced Practice Providers was explained to patient; also emphasized that residents, students are part of our team. Informed of her right to refuse care as she deems appropriate.  -No questions or concerns.   2. Pap smear for cervical cancer screening -Exam performed and findings discussed. -Educated on ASCCP guidelines regarding pap smear evaluation and frequency. -Informed of turnover time and provider/clinic policy on releasing results.  3. [redacted] weeks gestation of pregnancy -Doing well -Anticipatory guidance for upcoming appts.  -Reviewed Glucola appt preparation including fasting the  night before and morning of.   -Discussed anticipated office time of 2.5-3 hours.  -Reviewed blood draw procedures and labs which also include check of iron level.  -Discussed how results of GTT are handled including diabetic education and BS testing for abnormal results and routine care for normal results.   4. History of C-section -Desires TOLAC -Will schedule with MD for consent  5. Red blood cell antibody positive -Will send script with patient for collection of T&S  6. Obesity in pregnancy, antepartum -Start bASA regime, Rx sent to pharmacy  7. Need for immunization against influenza -Flu shot given  8. Bacterial vaginosis -Exam suspicious for BV. -Reviewed diagnosis and treatment -Rx Metrogel 0.75% PV QHS x 5days sent to pharmacy  on file.   8. Thyroid Enlargement -Findings reviewed with patient.  -Plan for TSH at next visit with GTT    Problem list reviewed and updated. Routine obstetric precautions reviewed.  No orders of the defined types were placed in this encounter.   Return in about 3 weeks (around 04/12/2020).     Cherre Robins, CNM 03/22/2020 9:21 AM

## 2020-03-23 ENCOUNTER — Encounter: Payer: Self-pay | Admitting: General Practice

## 2020-03-24 LAB — CYTOLOGY - PAP
Chlamydia: NEGATIVE
Comment: NEGATIVE
Comment: NEGATIVE
Comment: NORMAL
Diagnosis: HIGH — AB
Neisseria Gonorrhea: NEGATIVE
Trichomonas: NEGATIVE

## 2020-03-29 ENCOUNTER — Emergency Department (HOSPITAL_COMMUNITY): Payer: Medicaid Other

## 2020-03-29 ENCOUNTER — Encounter (HOSPITAL_COMMUNITY): Payer: Self-pay | Admitting: Obstetrics and Gynecology

## 2020-03-29 ENCOUNTER — Other Ambulatory Visit: Payer: Self-pay

## 2020-03-29 ENCOUNTER — Inpatient Hospital Stay (HOSPITAL_COMMUNITY)
Admission: EM | Admit: 2020-03-29 | Discharge: 2020-03-29 | Disposition: A | Discharge status: Home / Self Care | Payer: Medicaid Other | Attending: Obstetrics and Gynecology | Admitting: Obstetrics and Gynecology

## 2020-03-29 DIAGNOSIS — R0789 Other chest pain: Secondary | ICD-10-CM | POA: Insufficient documentation

## 2020-03-29 DIAGNOSIS — O99891 Other specified diseases and conditions complicating pregnancy: Secondary | ICD-10-CM | POA: Insufficient documentation

## 2020-03-29 DIAGNOSIS — Y92411 Interstate highway as the place of occurrence of the external cause: Secondary | ICD-10-CM | POA: Diagnosis not present

## 2020-03-29 DIAGNOSIS — Z3A26 26 weeks gestation of pregnancy: Secondary | ICD-10-CM | POA: Diagnosis not present

## 2020-03-29 DIAGNOSIS — S3991XA Unspecified injury of abdomen, initial encounter: Secondary | ICD-10-CM

## 2020-03-29 LAB — COMPREHENSIVE METABOLIC PANEL
ALT: 13 U/L (ref 0–44)
AST: 22 U/L (ref 15–41)
Albumin: 2.9 g/dL — ABNORMAL LOW (ref 3.5–5.0)
Alkaline Phosphatase: 72 U/L (ref 38–126)
Anion gap: 9 (ref 5–15)
BUN: 5 mg/dL — ABNORMAL LOW (ref 6–20)
CO2: 21 mmol/L — ABNORMAL LOW (ref 22–32)
Calcium: 9 mg/dL (ref 8.9–10.3)
Chloride: 105 mmol/L (ref 98–111)
Creatinine, Ser: 0.67 mg/dL (ref 0.44–1.00)
GFR, Estimated: >60 mL/min (ref >60)
Glucose, Bld: 92 mg/dL (ref 70–99)
Potassium: 3.6 mmol/L (ref 3.5–5.1)
Sodium: 135 mmol/L (ref 135–145)
Total Bilirubin: 0.5 mg/dL (ref 0.3–1.2)
Total Protein: 6.6 g/dL (ref 6.5–8.1)

## 2020-03-29 LAB — CBC WITH DIFFERENTIAL/PLATELET
Abs Immature Granulocytes: 0.05 10*3/uL (ref 0.00–0.07)
Basophils Absolute: 0 10*3/uL (ref 0.0–0.1)
Basophils Relative: 1 %
Eosinophils Absolute: 0.4 10*3/uL (ref 0.0–0.5)
Eosinophils Relative: 5 %
HCT: 35 % — ABNORMAL LOW (ref 36.0–46.0)
Hemoglobin: 11.3 g/dL — ABNORMAL LOW (ref 12.0–15.0)
Immature Granulocytes: 1 %
Lymphocytes Relative: 23 %
Lymphs Abs: 2.1 10*3/uL (ref 0.7–4.0)
MCH: 32.5 pg (ref 26.0–34.0)
MCHC: 32.3 g/dL (ref 30.0–36.0)
MCV: 100.6 fL — ABNORMAL HIGH (ref 80.0–100.0)
Monocytes Absolute: 0.7 10*3/uL (ref 0.1–1.0)
Monocytes Relative: 8 %
Neutro Abs: 5.5 10*3/uL (ref 1.7–7.7)
Neutrophils Relative %: 62 %
Platelets: 160 10*3/uL (ref 150–400)
RBC: 3.48 MIL/uL — ABNORMAL LOW (ref 3.87–5.11)
RDW: 13.6 % (ref 11.5–15.5)
WBC: 8.8 10*3/uL (ref 4.0–10.5)
nRBC: 0 % (ref 0.0–0.2)

## 2020-03-29 LAB — URINALYSIS, ROUTINE W REFLEX MICROSCOPIC
Bilirubin Urine: NEGATIVE
Glucose, UA: NEGATIVE mg/dL
Hgb urine dipstick: NEGATIVE
Ketones, ur: NEGATIVE mg/dL
Leukocytes,Ua: NEGATIVE
Nitrite: NEGATIVE
Protein, ur: NEGATIVE mg/dL
Specific Gravity, Urine: 1.003 — ABNORMAL LOW (ref 1.005–1.030)
pH: 7 (ref 5.0–8.0)

## 2020-03-29 LAB — ABO/RH: ABO/RH(D): O POS

## 2020-03-29 LAB — TROPONIN I (HIGH SENSITIVITY)
Troponin I (High Sensitivity): <2 ng/L (ref <18)
Troponin I (High Sensitivity): 3 ng/L (ref <18)

## 2020-03-29 LAB — LIPASE, BLOOD: Lipase: 27 U/L (ref 11–51)

## 2020-03-29 MED ORDER — ACETAMINOPHEN 500 MG PO TABS: 1000.0000 mg | ORAL_TABLET | Freq: Once | ORAL | Status: DC

## 2020-03-29 MED ORDER — ACETAMINOPHEN 500 MG PO TABS
1000.0000 mg | ORAL_TABLET | Freq: Once | ORAL | Status: AC
  Administered 2020-03-29: 18:00:00 1000 mg via ORAL
  Filled 2020-03-29 (×2): qty 2

## 2020-03-29 MED ORDER — IOHEXOL 300 MG/ML  SOLN
100.0000 mL | Freq: Once | INTRAMUSCULAR | Status: AC | PRN
  Administered 2020-03-29: 100 mL via INTRAVENOUS

## 2020-03-29 MED ORDER — SODIUM CHLORIDE 0.9 % IV BOLUS
1000.0000 mL | Freq: Once | INTRAVENOUS | Status: AC
  Administered 2020-03-29: 16:00:00 1000 mL via INTRAVENOUS
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
## 2020-03-29 NOTE — ED Notes (Signed)
Pt taken to CT.
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
## 2020-03-29 NOTE — Discharge Instructions (Signed)
Fetal Movement Counts Patient Name: ________________________________________________ Patient Due Date: ____________________ What is a fetal movement count?  A fetal movement count is the number of times that you feel your baby move during a certain amount of time. This may also be called a fetal kick count. A fetal movement count is recommended for every pregnant woman. You may be asked to start counting fetal movements as early as week 28 of your pregnancy. Pay attention to when your baby is most active. You may notice your baby's sleep and wake cycles. You may also notice things that make your baby move more. You should do a fetal movement count:  When your baby is normally most active.  At the same time each day. A good time to count movements is while you are resting, after having something to eat and drink. How do I count fetal movements? 1. Find a quiet, comfortable area. Sit, or lie down on your side. 2. Write down the date, the start time and stop time, and the number of movements that you felt between those two times. Take this information with you to your health care visits. 3. Write down your start time when you feel the first movement. 4. Count kicks, flutters, swishes, rolls, and jabs. You should feel at least 10 movements. 5. You may stop counting after you have felt 10 movements, or if you have been counting for 2 hours. Write down the stop time. 6. If you do not feel 10 movements in 2 hours, contact your health care provider for further instructions. Your health care provider may want to do additional tests to assess your baby's well-being. Contact a health care provider if:  You feel fewer than 10 movements in 2 hours.  Your baby is not moving like he or she usually does. Date: ____________ Start time: ____________ Stop time: ____________ Movements: ____________ Date: ____________ Start time: ____________ Stop time: ____________ Movements: ____________ Date: ____________  Start time: ____________ Stop time: ____________ Movements: ____________ Date: ____________ Start time: ____________ Stop time: ____________ Movements: ____________ Date: ____________ Start time: ____________ Stop time: ____________ Movements: ____________ Date: ____________ Start time: ____________ Stop time: ____________ Movements: ____________ Date: ____________ Start time: ____________ Stop time: ____________ Movements: ____________ Date: ____________ Start time: ____________ Stop time: ____________ Movements: ____________ Date: ____________ Start time: ____________ Stop time: ____________ Movements: ____________ This information is not intended to replace advice given to you by your health care provider. Make sure you discuss any questions you have with your health care provider. Document Revised: 11/19/2018 Document Reviewed: 11/19/2018 Elsevier Patient Education  2020 [employer]  

## 2020-03-29 NOTE — Progress Notes (Signed)
MAU called and given report.  Will transport when cleared by ED
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
## 2020-04-04 ENCOUNTER — Other Ambulatory Visit: Payer: Self-pay

## 2020-04-04 ENCOUNTER — Encounter (HOSPITAL_COMMUNITY): Payer: Self-pay

## 2020-04-04 ENCOUNTER — Emergency Department (HOSPITAL_COMMUNITY)
Admission: EM | Admit: 2020-04-04 | Discharge: 2020-04-04 | Disposition: A | Discharge status: Home / Self Care | Payer: Medicaid Other | Attending: Emergency Medicine | Admitting: Emergency Medicine

## 2020-04-04 DIAGNOSIS — R0789 Other chest pain: Secondary | ICD-10-CM | POA: Insufficient documentation

## 2020-04-04 DIAGNOSIS — M545 Low back pain, unspecified: Secondary | ICD-10-CM | POA: Diagnosis not present

## 2020-04-04 DIAGNOSIS — Z5321 Procedure and treatment not carried out due to patient leaving prior to being seen by health care provider: Secondary | ICD-10-CM | POA: Insufficient documentation

## 2020-04-04 NOTE — ED Notes (Signed)
Pt states they are leaving  
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
## 2020-04-05 ENCOUNTER — Telehealth: Payer: Self-pay | Admitting: *Deleted

## 2020-04-05 ENCOUNTER — Telehealth: Payer: Self-pay | Admitting: General Practice

## 2020-04-05 NOTE — Telephone Encounter (Signed)
Patient called regarding PAP results. Patient verified DOB. 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 Patient advised she will need a colposcopy for further testing. Someone will call her with an appointment.   Patient also reported that she went to [facility] ED yesterday and waited for hours and still didn't see the doctor. Patient left without being seen. Advised patient to go to MAU if she is having any types of pain. Address given for [facility].  Clovis Pu, RN

## 2020-04-05 NOTE — Telephone Encounter (Signed)
-----   Message from Gerrit Heck, [place] sent at 03/31/2020 10:18 PM EST ----- Please call patient and inform of results and need for colposcopy.  Thank you, JE

## 2020-04-11 ENCOUNTER — Inpatient Hospital Stay (HOSPITAL_COMMUNITY)
Admission: EM | Admit: 2020-04-11 | Discharge: 2020-04-11 | Disposition: A | Discharge status: Home / Self Care | Payer: Medicaid Other | Attending: Family Medicine | Admitting: Family Medicine

## 2020-04-11 ENCOUNTER — Inpatient Hospital Stay (HOSPITAL_COMMUNITY): Payer: Medicaid Other

## 2020-04-11 ENCOUNTER — Other Ambulatory Visit: Payer: Self-pay

## 2020-04-11 ENCOUNTER — Emergency Department (HOSPITAL_COMMUNITY): Payer: Medicaid Other

## 2020-04-11 ENCOUNTER — Encounter (HOSPITAL_COMMUNITY): Payer: Self-pay | Admitting: Family Medicine

## 2020-04-11 DIAGNOSIS — O26893 Other specified pregnancy related conditions, third trimester: Secondary | ICD-10-CM | POA: Insufficient documentation

## 2020-04-11 DIAGNOSIS — O98513 Other viral diseases complicating pregnancy, third trimester: Secondary | ICD-10-CM | POA: Insufficient documentation

## 2020-04-11 DIAGNOSIS — Z3A28 28 weeks gestation of pregnancy: Secondary | ICD-10-CM | POA: Insufficient documentation

## 2020-04-11 DIAGNOSIS — R0789 Other chest pain: Secondary | ICD-10-CM | POA: Diagnosis not present

## 2020-04-11 DIAGNOSIS — O219 Vomiting of pregnancy, unspecified: Secondary | ICD-10-CM

## 2020-04-11 DIAGNOSIS — Z3480 Encounter for supervision of other normal pregnancy, unspecified trimester: Secondary | ICD-10-CM

## 2020-04-11 DIAGNOSIS — M62830 Muscle spasm of back: Secondary | ICD-10-CM

## 2020-04-11 DIAGNOSIS — O99891 Other specified diseases and conditions complicating pregnancy: Secondary | ICD-10-CM | POA: Diagnosis not present

## 2020-04-11 DIAGNOSIS — U071 COVID-19: Secondary | ICD-10-CM

## 2020-04-11 DIAGNOSIS — M549 Dorsalgia, unspecified: Secondary | ICD-10-CM | POA: Insufficient documentation

## 2020-04-11 LAB — URINALYSIS, ROUTINE W REFLEX MICROSCOPIC
Bilirubin Urine: NEGATIVE
Glucose, UA: NEGATIVE mg/dL
Hgb urine dipstick: NEGATIVE
Ketones, ur: 5 mg/dL — AB
Leukocytes,Ua: NEGATIVE
Nitrite: NEGATIVE
Protein, ur: 30 mg/dL — AB
Specific Gravity, Urine: 1.021 (ref 1.005–1.030)
pH: 6 (ref 5.0–8.0)

## 2020-04-11 LAB — RESP PANEL BY RT-PCR (FLU A&B, COVID) ARPGX2
Influenza A by PCR: NEGATIVE
Influenza B by PCR: NEGATIVE
SARS Coronavirus 2 by RT PCR: POSITIVE — AB

## 2020-04-11 MED ORDER — LACTATED RINGERS IV BOLUS
1000.0000 mL | Freq: Once | INTRAVENOUS | Status: AC
  Administered 2020-04-11: 08:00:00 1000 mL via INTRAVENOUS

## 2020-04-11 MED ORDER — IOHEXOL 350 MG/ML SOLN
60.0000 mL | Freq: Once | INTRAVENOUS | Status: AC | PRN
  Administered 2020-04-11: 60 mL via INTRAVENOUS

## 2020-04-11 MED ORDER — CYCLOBENZAPRINE HCL 5 MG PO TABS
5.0000 mg | ORAL_TABLET | Freq: Once | ORAL | Status: AC
  Administered 2020-04-11: 13:00:00 5 mg via ORAL
  Filled 2020-04-11: qty 1

## 2020-04-11 MED ORDER — ACETAMINOPHEN 500 MG PO TABS
1000.0000 mg | ORAL_TABLET | Freq: Once | ORAL | Status: AC
  Administered 2020-04-11: 15:00:00 1000 mg via ORAL
  Filled 2020-04-11: qty 2

## 2020-04-11 MED ORDER — PANTOPRAZOLE SODIUM 20 MG PO TBEC: 20.0000 mg | DELAYED_RELEASE_TABLET | Freq: Every day | ORAL | 1 refills | Status: DC

## 2020-04-11 MED ORDER — PRENATAL 27-1 MG PO TABS: 1.0000 | ORAL_TABLET | Freq: Every day | ORAL | 12 refills | Status: AC

## 2020-04-11 MED ORDER — FAMOTIDINE IN NACL 20-0.9 MG/50ML-% IV SOLN
20.0000 mg | Freq: Once | INTRAVENOUS | Status: DC | PRN
  Filled 2020-04-11 (×2): qty 50

## 2020-04-11 MED ORDER — METHYLPREDNISOLONE SODIUM SUCC 125 MG IJ SOLR: 125.0000 mg | Freq: Once | INTRAMUSCULAR | Status: DC | PRN

## 2020-04-11 MED ORDER — EPINEPHRINE 0.3 MG/0.3ML IJ SOAJ
0.3000 mg | Freq: Once | INTRAMUSCULAR | Status: DC | PRN
  Filled 2020-04-11: qty 0.6

## 2020-04-11 MED ORDER — DIPHENHYDRAMINE HCL 50 MG/ML IJ SOLN: 50.0000 mg | Freq: Once | INTRAMUSCULAR | Status: DC | PRN

## 2020-04-11 MED ORDER — CYCLOBENZAPRINE HCL 10 MG PO TABS: 10.0000 mg | ORAL_TABLET | Freq: Two times a day (BID) | ORAL | 0 refills | Status: DC | PRN

## 2020-04-11 MED ORDER — ALBUTEROL SULFATE HFA 108 (90 BASE) MCG/ACT IN AERS
2.0000 | INHALATION_SPRAY | Freq: Once | RESPIRATORY_TRACT | Status: DC | PRN
  Filled 2020-04-11: qty 6.7

## 2020-04-11 MED ORDER — ONDANSETRON HCL 4 MG PO TABS: 4.0000 mg | ORAL_TABLET | Freq: Four times a day (QID) | ORAL | 0 refills | Status: DC

## 2020-04-11 MED ORDER — SODIUM CHLORIDE 0.9 % IV SOLN
INTRAVENOUS | Status: DC | PRN
  Administered 2020-04-11: 13:00:00 500 mL via INTRAVENOUS

## 2020-04-11 MED ORDER — SODIUM CHLORIDE 0.9 % IV SOLN
Freq: Once | INTRAVENOUS | Status: AC
  Filled 2020-04-11: qty 5
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
## 2020-04-11 NOTE — Progress Notes (Signed)
Patient to Xray for chest scan. Patient is moving side to side constantly.  Continue to adjust monitors.  Good fetal movement audible and reported by patient. MAU Charge RN notified of pending transfer when cleared by ED service.

## 2020-04-11 NOTE — ED Notes (Signed)
Report given to rapid OB nurse

## 2020-04-11 NOTE — Discharge Instructions (Signed)
COVID-19: How to Protect Yourself and Others Know how it spreads  There is currently no vaccine to prevent coronavirus disease 2019 (COVID-19).  The best way to prevent illness is to avoid being exposed to this virus.  The virus is thought to spread mainly from person-to-person. ? Between people who are in close contact with one another (within about 6 feet). ? Through respiratory droplets produced when an infected person coughs, sneezes or talks. ? These droplets can land in the mouths or noses of people who are nearby or possibly be inhaled into the lungs. ? COVID-19 may be spread by people who are not showing symptoms. Everyone should Clean your hands often  Wash your hands often with soap and water for at least 20 seconds especially after you have been in a public place, or after blowing your nose, coughing, or sneezing.  If soap and water are not readily available, use a hand sanitizer that contains at least 60% alcohol. Cover all surfaces of your hands and rub them together until they feel dry.  Avoid touching your eyes, nose, and mouth with unwashed hands. Avoid close contact  Limit contact with others as much as possible.  Avoid close contact with people who are sick.  Put distance between yourself and other people. ? Remember that some people without symptoms may be able to spread virus. ? This is especially important for people who are at higher risk of getting very GainPain.com.cy Cover your mouth and nose with a mask when around others  You could spread COVID-19 to others even if you do not feel sick.  Everyone should wear a mask in public settings and when around people not living in their household, especially when social distancing is difficult to maintain. ? Masks should not be placed on young children under age 6, anyone who has trouble breathing, or is unconscious, incapacitated or otherwise  unable to remove the mask without assistance.  The mask is meant to protect other people in case you are infected.  Do NOT use a facemask meant for a Dietitian.  Continue to keep about 6 feet between yourself and others. The mask is not a substitute for social distancing. Cover coughs and sneezes  Always cover your mouth and nose with a tissue when you cough or sneeze or use the inside of your elbow.  Throw used tissues in the trash.  Immediately wash your hands with soap and water for at least 20 seconds. If soap and water are not readily available, clean your hands with a hand sanitizer that contains at least 60% alcohol. Clean and disinfect  Clean AND disinfect frequently touched surfaces daily. This includes tables, doorknobs, light switches, countertops, handles, desks, phones, keyboards, toilets, faucets, and sinks. RackRewards.fr  If surfaces are dirty, clean them: Use detergent or soap and water prior to disinfection.  Then, use a household disinfectant. You can see a list of EPA-registered household disinfectants here. michellinders.com 12/16/2018 This information is not intended to replace advice given to you by your health care provider. Make sure you discuss any questions you have with your health care provider. Document Revised: 12/24/2018 Document Reviewed: 10/22/2018 Elsevier Patient Education  [place].   COVID-19 Frequently Asked Questions COVID-19 (coronavirus disease) is an infection that is caused by a large family of viruses. Some viruses cause illness in people and others cause illness in animals like camels, cats, and bats. In some cases, the viruses that cause illness in animals can spread to humans. Where  did the coronavirus come from? In December 2019, [place] told the [facility]) of several cases of lung disease (human respiratory illness). These cases  were linked to an open seafood and livestock market in the city of [place]. The link to the seafood and livestock market suggests that the virus may have spread from animals to humans. However, since that first outbreak in December, the virus has also been shown to spread from person to person. What is the name of the disease and the virus? Disease name Early on, this disease was called novel coronavirus. This is because scientists determined that the disease was caused by a new (novel) respiratory virus. The World Health Organization [facility]) has now named the disease COVID-19, or coronavirus disease. Virus name The virus that causes the disease is called severe acute respiratory syndrome coronavirus 2 (SARS-CoV-2). More information on disease and virus naming World Health Organization [facility]): www.who.int/emergencies/diseases/novel-coronavirus-2019/technical-guidance/naming-the-coronavirus-disease-(covid-2019)-and-the-virus-that-causes-it Who is at risk for complications from coronavirus disease? Some people may be at higher risk for complications from coronavirus disease. This includes older adults and people who have chronic diseases, such as heart disease, diabetes, and lung disease. If you are at higher risk for complications, take these extra precautions:  Stay home as much as possible.  Avoid social gatherings and travel.  Avoid close contact with others. Stay at least 6 ft (2 m) away from others, if possible.  Wash your hands often with soap and water for at least 20 seconds.  Avoid touching your face, mouth, nose, or eyes.  Keep supplies on hand at home, such as food, medicine, and cleaning supplies.  If you must go out in public, wear a cloth face covering or face mask. Make sure your mask covers your nose and mouth. How does coronavirus disease spread? The virus that causes coronavirus disease spreads easily from person to person (is contagious). You may catch the virus  by:  Breathing in droplets from an infected person. Droplets can be spread by a person breathing, speaking, singing, coughing, or sneezing.  Touching something, like a table or a doorknob, that was exposed to the virus (contaminated) and then touching your mouth, nose, or eyes. Can I get the virus from touching surfaces or objects? There is still a lot that we do not know about the virus that causes coronavirus disease. Scientists are basing a lot of information on what they know about similar viruses, such as:  Viruses cannot generally survive on surfaces for long. They need a human body (host) to survive.  It is more likely that the virus is spread by close contact with people who are sick (direct contact), such as through: ? Shaking hands or hugging. ? Breathing in respiratory droplets that travel through the air. Droplets can be spread by a person breathing, speaking, singing, coughing, or sneezing.  It is less likely that the virus is spread when a person touches a surface or object that has the virus on it (indirect contact). The virus may be able to enter the body if the person touches a surface or object and then touches his or her face, eyes, nose, or mouth. Can a person spread the virus without having symptoms of the disease? It may be possible for the virus to spread before a person has symptoms of the disease, but this is most likely not the main way the virus is spreading. It is more likely for the virus to spread by being in close contact with people who are sick and  breathing in the respiratory droplets spread by a person breathing, speaking, singing, coughing, or sneezing. What are the symptoms of coronavirus disease? Symptoms vary from person to person and can range from mild to severe. Symptoms may include:  Fever or chills.  Cough.  Difficulty breathing or feeling short of breath.  Headaches, body aches, or muscle aches.  Runny or stuffy (congested) nose.  Sore  throat.  New loss of taste or smell.  Nausea, vomiting, or diarrhea. These symptoms can appear anywhere from 2 to 14 days after you have been exposed to the virus. Some people may not have any symptoms. If you develop symptoms, call your health care provider. People with severe symptoms may need hospital care. Should I be tested for this virus? Your health care provider will decide whether to test you based on your symptoms, history of exposure, and your risk factors. How does a health care provider test for this virus? Health care providers will collect samples to send for testing. Samples may include:  Taking a swab of fluid from the back of your nose and throat, your nose, or your throat.  Taking fluid from the lungs by having you cough up mucus (sputum) into a sterile cup.  Taking a blood sample. Is there a treatment or vaccine for this virus? Currently, there is no vaccine to prevent coronavirus disease. Also, there are no medicines like antibiotics or antivirals to treat the virus. A person who becomes sick is given supportive care, which means rest and fluids. A person may also relieve his or her symptoms by using over-the-counter medicines that treat sneezing, coughing, and runny nose. These are the same medicines that a person takes for the common cold. If you develop symptoms, call your health care provider. People with severe symptoms may need hospital care. What can I do to protect myself and my family from this virus?     You can protect yourself and your family by taking the same actions that you would take to prevent the spread of other viruses. Take the following actions:  Wash your hands often with soap and water for at least 20 seconds. If soap and water are not available, use alcohol-based hand sanitizer.  Avoid touching your face, mouth, nose, or eyes.  Cough or sneeze into a tissue, sleeve, or elbow. Do not cough or sneeze into your hand or the air. ? If you cough  or sneeze into a tissue, throw it away immediately and wash your hands.  Disinfect objects and surfaces that you frequently touch every day.  Stay away from people who are sick.  Avoid going out in public, follow guidance from your state and local health authorities.  Avoid crowded indoor spaces. Stay at least 6 ft (2 m) away from others.  If you must go out in public, wear a cloth face covering or face mask. Make sure your mask covers your nose and mouth.  Stay home if you are sick, except to get medical care. Call your health care provider before you get medical care. Your health care provider will tell you how long to stay home.  Make sure your vaccines are up to date. Ask your health care provider what vaccines you need. What should I do if I need to travel? Follow travel recommendations from your local health authority, the CDC, and WHO. Travel information and advice  Centers for Disease Control and Prevention (CDC): BodyEditor.hu  World Health Organization [facility]): ThirdIncome.ca Know the risks and take action to  protect your health  You are at higher risk of getting coronavirus disease if you are traveling to areas with an outbreak or if you are exposed to travelers from areas with an outbreak.  Wash your hands often and practice good hygiene to lower the risk of catching or spreading the virus. What should I do if I am sick? General instructions to stop the spread of infection  Wash your hands often with soap and water for at least 20 seconds. If soap and water are not available, use alcohol-based hand sanitizer.  Cough or sneeze into a tissue, sleeve, or elbow. Do not cough or sneeze into your hand or the air.  If you cough or sneeze into a tissue, throw it away immediately and wash your hands.  Stay home unless you must get medical care. Call your health care provider or local  health authority before you get medical care.  Avoid public areas. Do not take public transportation, if possible.  If you can, wear a mask if you must go out of the house or if you are in close contact with someone who is not sick. Make sure your mask covers your nose and mouth. Keep your home clean  Disinfect objects and surfaces that are frequently touched every day. This may include: ? Counters and tables. ? Doorknobs and light switches. ? Sinks and faucets. ? Electronics such as phones, remote controls, keyboards, computers, and tablets.  Wash dishes in hot, soapy water or use a dishwasher. Air-dry your dishes.  Wash laundry in hot water. Prevent infecting other household members  Let healthy household members care for children and pets, if possible. If you have to care for children or pets, wash your hands often and wear a mask.  Sleep in a different bedroom or bed, if possible.  Do not share personal items, such as razors, toothbrushes, deodorant, combs, brushes, towels, and washcloths. Where to find more information Centers for Disease Control and Prevention (CDC)  Information and news updates: https://www.butler-gonzalez.com/ World Health Organization [facility])  Information and news updates: MissExecutive.com.ee  Coronavirus health topic: https://www.castaneda.info/  Questions and answers on COVID-19: OpportunityDebt.at  Global tracker: who.sprinklr.com American Academy of Pediatrics (AAP)  Information for families: www.healthychildren.org/English/health-issues/conditions/chest-lungs/Pages/2019-Novel-Coronavirus.aspx The coronavirus situation is changing rapidly. Check your local health authority website or the CDC and [facility] websites for updates and news. When should I contact a health care provider?  Contact your health care provider if you have symptoms of an infection, such as fever or cough,  and you: ? Have been near anyone who is known to have coronavirus disease. ? Have come into contact with a person who is suspected to have coronavirus disease. ? Have traveled to an area where there is an outbreak of COVID-19. When should I get emergency medical care?  Get help right away by calling your local emergency services (911 in the [place] if you have: ? Trouble breathing. ? Pain or pressure in your chest. ? Confusion. ? Blue-tinged lips and fingernails. ? Difficulty waking from sleep. ? Symptoms that get worse. Let the emergency medical personnel know if you think you have coronavirus disease. Summary  A new respiratory virus is spreading from person to person and causing COVID-19 (coronavirus disease).  The virus that causes COVID-19 appears to spread easily. It spreads from one person to another through droplets from breathing, speaking, singing, coughing, or sneezing.  Older adults and those with chronic diseases are at higher risk of disease. If you are at higher risk for  complications, take extra precautions.  There is currently no vaccine to prevent coronavirus disease. There are no medicines, such as antibiotics or antivirals, to treat the virus.  You can protect yourself and your family by washing your hands often, avoiding touching your face, and covering your coughs and sneezes. This information is not intended to replace advice given to you by your health care provider. Make sure you discuss any questions you have with your health care provider. Document Revised: 01/29/2019 Document Reviewed: 07/28/2018 Elsevier Patient Education  2020 Elsevier Inc.  Heartburn During Pregnancy  Heartburn is pain or discomfort in the throat or chest. It may cause a burning feeling. It happens when stomach acid moves up into the tube that carries food from your mouth to your stomach (esophagus). Heartburn is common during pregnancy. It usually goes away or gets better after giving  birth. Follow these instructions at home: Eating and drinking  Do not drink alcohol while you are pregnant.  Figure out which foods and beverages make you feel worse, and avoid them.  Beverages that you may want to avoid include: ? Coffee and tea (with or without caffeine). ? Energy drinks and sports drinks. ? Bubbly (carbonated) drinks or sodas. ? Citrus fruit juices.  Foods that you may want to avoid include: ? Chocolate and cocoa. ? Peppermint and mint flavorings. ? Garlic, onions, and horseradish. ? Spicy and acidic foods. These include peppers, chili powder, curry powder, vinegar, hot sauces, and barbecue sauce. ? Citrus fruits, such as oranges, lemons, and limes. ? Tomato-based foods, such as red sauce, chili, and salsa. ? Fried and fatty foods, such as donuts, french fries, potato chips, and high-fat dressings. ? High-fat meats, such as hot dogs, cold cuts, sausage, ham, and bacon. ? High-fat dairy items, such as whole milk, butter, and cheese.  Eat small meals often, instead of large meals.  Avoid drinking a lot of liquid with your meals.  Avoid eating meals during the 2-3 hours before you go to bed.  Avoid lying down right after you eat.  Do not exercise right after you eat. Medicines  Take over-the-counter and prescription medicines only as told by your doctor.  Do not take aspirin, ibuprofen, or other NSAIDs unless your doctor tells you to do that.  Your doctor may tell you to avoid medicines that have sodium bicarbonate in them. General instructions   If told, raise the head of your bed about 6 inches (15 cm). You can do this by putting blocks under the legs. Sleeping with more pillows does not help with heartburn.  Do not use any products that contain nicotine or tobacco, such as cigarettes and e-cigarettes. If you need help quitting, ask your doctor.  Wear loose-fitting clothing.  Try to lower your stress, such as with yoga or meditation. If you need  help, ask your doctor.  Stay at a healthy weight. If you are overweight, work with your doctor to safely lose weight.  Keep all follow-up visits as told by your doctor. This is important. Contact a doctor if:  You get new symptoms.  Your symptoms do not get better with treatment.  You have weight loss and you do not know why.  You have trouble swallowing.  You make loud sounds when you breathe (wheeze).  You have a cough that does not go away.  You have heartburn often for more than 2 weeks.  You feel sick to your stomach (nauseous), and this does not get better with treatment.  You are throwing up (vomiting), and this does not get better with treatment.  You have pain in your belly (abdomen). Get help right away if:  You have very bad chest pain that spreads to your arm, neck, or jaw.  You feel sweaty, dizzy, or light-headed.  You have trouble breathing.  You have pain when swallowing.  You throw up and your throw-up looks like blood or coffee grounds.  Your poop (stool) is bloody or black. This information is not intended to replace advice given to you by your health care provider. Make sure you discuss any questions you have with your health care provider. Document Revised: 07/23/2018 Document Reviewed: 12/18/2015 Elsevier Patient Education  2020 Elsevier Inc.  Fetal Movement Counts Patient Name: ________________________________________________ Patient Due Date: ____________________ What is a fetal movement count?  A fetal movement count is the number of times that you feel your baby move during a certain amount of time. This may also be called a fetal kick count. A fetal movement count is recommended for every pregnant woman. You may be asked to start counting fetal movements as early as week 28 of your pregnancy. Pay attention to when your baby is most active. You may notice your baby's sleep and wake cycles. You may also notice things that make your baby move  more. You should do a fetal movement count:  When your baby is normally most active.  At the same time each day. A good time to count movements is while you are resting, after having something to eat and drink. How do I count fetal movements? 1. Find a quiet, comfortable area. Sit, or lie down on your side. 2. Write down the date, the start time and stop time, and the number of movements that you felt between those two times. Take this information with you to your health care visits. 3. Write down your start time when you feel the first movement. 4. Count kicks, flutters, swishes, rolls, and jabs. You should feel at least 10 movements. 5. You may stop counting after you have felt 10 movements, or if you have been counting for 2 hours. Write down the stop time. 6. If you do not feel 10 movements in 2 hours, contact your health care provider for further instructions. Your health care provider may want to do additional tests to assess your baby's well-being. Contact a health care provider if:  You feel fewer than 10 movements in 2 hours.  Your baby is not moving like he or she usually does. Date: ____________ Start time: ____________ Stop time: ____________ Movements: ____________ Date: ____________ Start time: ____________ Stop time: ____________ Movements: ____________ Date: ____________ Start time: ____________ Stop time: ____________ Movements: ____________ Date: ____________ Start time: ____________ Stop time: ____________ Movements: ____________ Date: ____________ Start time: ____________ Stop time: ____________ Movements: ____________ Date: ____________ Start time: ____________ Stop time: ____________ Movements: ____________ Date: ____________ Start time: ____________ Stop time: ____________ Movements: ____________ Date: ____________ Start time: ____________ Stop time: ____________ Movements: ____________ Date: ____________ Start time: ____________ Stop time: ____________ Movements:  ____________ This information is not intended to replace advice given to you by your health care provider. Make sure you discuss any questions you have with your health care provider. Document Revised: 11/19/2018 Document Reviewed: 11/19/2018 Elsevier Patient Education  2020 Elsevier Inc.  [employer] of the uterus can occur throughout pregnancy, but they are not always a sign that you are in labor. You may have practice contractions called Braxton Hicks contractions. These  false labor contractions are sometimes confused with true labor. What are Deberah Pelton contractions? Braxton Hicks contractions are tightening movements that occur in the muscles of the uterus before labor. Unlike true labor contractions, these contractions do not result in opening (dilation) and thinning of the cervix. Toward the end of pregnancy (32-34 weeks), Braxton Hicks contractions can happen more often and may become stronger. These contractions are sometimes difficult to tell apart from true labor because they can be very uncomfortable. You should not feel embarrassed if you go to the hospital with false labor. Sometimes, the only way to tell if you are in true labor is for your health care provider to look for changes in the cervix. The health care provider will do a physical exam and may monitor your contractions. If you are not in true labor, the exam should show that your cervix is not dilating and your water has not broken. If there are no other health problems associated with your pregnancy, it is completely safe for you to be sent home with false labor. You may continue to have Braxton Hicks contractions until you go into true labor. How to tell the difference between true labor and false labor True labor  Contractions last 30-70 seconds.  Contractions become very regular.  Discomfort is usually felt in the top of the uterus, and it spreads to the lower abdomen and low  back.  Contractions do not go away with walking.  Contractions usually become more intense and increase in frequency.  The cervix dilates and gets thinner. False labor  Contractions are usually shorter and not as strong as true labor contractions.  Contractions are usually irregular.  Contractions are often felt in the front of the lower abdomen and in the groin.  Contractions may go away when you walk around or change positions while lying down.  Contractions get weaker and are shorter-lasting as time goes on.  The cervix usually does not dilate or become thin. Follow these instructions at home:   Take over-the-counter and prescription medicines only as told by your health care provider.  Keep up with your usual exercises and follow other instructions from your health care provider.  Eat and drink lightly if you think you are going into labor.  If Braxton Hicks contractions are making you uncomfortable: ? Change your position from lying down or resting to walking, or change from walking to resting. ? Sit and rest in a tub of warm water. ? Drink enough fluid to keep your urine pale yellow. Dehydration may cause these contractions. ? Do slow and deep breathing several times an hour.  Keep all follow-up prenatal visits as told by your health care provider. This is important. Contact a health care provider if:  You have a fever.  You have continuous pain in your abdomen. Get help right away if:  Your contractions become stronger, more regular, and closer together.  You have fluid leaking or gushing from your vagina.  You pass blood-tinged mucus (bloody show).  You have bleeding from your vagina.  You have low back pain that you never had before.  You feel your baby's head pushing down and causing pelvic pressure.  Your baby is not moving inside you as much as it used to. Summary  Contractions that occur before labor are called Braxton Hicks contractions, false  labor, or practice contractions.  Braxton Hicks contractions are usually shorter, weaker, farther apart, and less regular than true labor contractions. True labor contractions usually become progressively  stronger and regular, and they become more frequent.  Manage discomfort from [facility] Inc[facility]xton Hicks contractions by changing position, resting in a warm bath, drinking plenty of water, or practicing deep breathing. This information is not intended to replace advice given to you by your health care provider. Make sure you discuss any questions you have with your health care provider. Document Revised: 03/14/2017 Document Reviewed: 08/15/2016 Elsevier Patient Education  2020 [employer]

## 2020-04-11 NOTE — Progress Notes (Signed)
Dr Candelaria Celeste updated on pt status.  Cleared by ED service.  NST appropriate for [redacted] week gestation.  Cleared by OB service.  Patient may D/C home.  Needs to make appt with Clinic in next 10 days.  Patient understands.

## 2020-04-11 NOTE — MAU Provider Note (Signed)
NST Reviewed Fetal Monitoring: Baseline: 150 bpm Variability: moderate Accelerations: 15 x 15, 10 x 10 Decelerations: None Contractions: none  Vonzella Nipple, PA-C 04/11/2020 4:14 PM

## 2020-04-11 NOTE — ED Notes (Signed)
Called OB Rapid Response to RN Tyrone Sage

## 2020-04-11 NOTE — Progress Notes (Signed)
Patient back from xray.  Monitors reapplied.  Pt laying on left side sleeping.
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
## 2020-04-11 NOTE — ED Notes (Signed)
Rapid OB RN at bedside.  
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
## 2020-04-11 NOTE — MAU Note (Addendum)
Sent from ED, came in for chest pain. eval in ED.  RRN- OB monitored pt.  Sent to MAU after covid came back +.  Pt up to bathroom upon arrival to MAU

## 2020-04-18 ENCOUNTER — Ambulatory Visit: Payer: Medicaid Other

## 2020-04-28 ENCOUNTER — Other Ambulatory Visit: Payer: Self-pay

## 2020-04-28 ENCOUNTER — Other Ambulatory Visit (HOSPITAL_COMMUNITY)
Admission: RE | Admit: 2020-04-28 | Discharge: 2020-04-28 | Disposition: A | Discharge status: Home / Self Care | Payer: Medicaid Other | Source: Ambulatory Visit

## 2020-04-28 ENCOUNTER — Ambulatory Visit (INDEPENDENT_AMBULATORY_CARE_PROVIDER_SITE_OTHER): Payer: Medicaid Other

## 2020-04-28 VITALS — BP 118/69 | HR 108 | Temp 98.1°F | Wt 216.2 lb

## 2020-04-28 DIAGNOSIS — Z3480 Encounter for supervision of other normal pregnancy, unspecified trimester: Secondary | ICD-10-CM | POA: Diagnosis not present

## 2020-04-28 DIAGNOSIS — N898 Other specified noninflammatory disorders of vagina: Secondary | ICD-10-CM

## 2020-04-28 DIAGNOSIS — Z3A31 31 weeks gestation of pregnancy: Secondary | ICD-10-CM | POA: Diagnosis present

## 2020-04-28 MED ORDER — TERCONAZOLE 0.4 % VA CREA: 1.0000 | TOPICAL_CREAM | Freq: Every day | VAGINAL | 0 refills | Status: DC

## 2020-04-28 NOTE — Patient Instructions (Signed)
Vaginal Yeast Infection, Adult  Vaginal yeast infection is a condition that causes vaginal discharge as well as soreness, swelling, and redness (inflammation) of the vagina. This is a common condition. Some women get this infection frequently. What are the causes? This condition is caused by a change in the normal balance of the yeast (candida) and bacteria that live in the vagina. This change causes an overgrowth of yeast, which causes the inflammation. What increases the risk? The condition is more likely to develop in women who:  Take antibiotic medicines.  Have diabetes.  Take birth control pills.  Are pregnant.  Douche often.  Have a weak body defense system (immune system).  Have been taking steroid medicines for a long time.  Frequently wear tight clothing. What are the signs or symptoms? Symptoms of this condition include:  White, thick, creamy vaginal discharge.  Swelling, itching, redness, and irritation of the vagina. The lips of the vagina (vulva) may be affected as well.  Pain or a burning feeling while urinating.  Pain during sex. How is this diagnosed? This condition is diagnosed based on:  Your medical history.  A physical exam.  A pelvic exam. Your health care provider will examine a sample of your vaginal discharge under a microscope. Your health care provider may send this sample for testing to confirm the diagnosis. How is this treated? This condition is treated with medicine. Medicines may be over-the-counter or prescription. You may be told to use one or more of the following:  Medicine that is taken by mouth (orally).  Medicine that is applied as a cream (topically).  Medicine that is inserted directly into the vagina (suppository). Follow these instructions at home: Lifestyle  Do not have sex until your health care provider approves. Tell your sex partner that you have a yeast infection. That person should go to his or her health care  provider and ask if they should also be treated.  Do not wear tight clothes, such as pantyhose or tight pants.  Wear breathable cotton underwear. General instructions  Take or apply over-the-counter and prescription medicines only as told by your health care provider.  Eat more yogurt. This may help to keep your yeast infection from returning.  Do not use tampons until your health care provider approves.  Try taking a sitz bath to help with discomfort. This is a warm water bath that is taken while you are sitting down. The water should only come up to your hips and should cover your buttocks. Do this 3-4 times per day or as told by your health care provider.  Do not douche.  If you have diabetes, keep your blood sugar levels under control.  Keep all follow-up visits as told by your health care provider. This is important.   Contact a health care provider if:  You have a fever.  Your symptoms go away and then return.  Your symptoms do not get better with treatment.  Your symptoms get worse.  You have new symptoms.  You develop blisters in or around your vagina.  You have blood coming from your vagina and it is not your menstrual period.  You develop pain in your abdomen. Summary  Vaginal yeast infection is a condition that causes discharge as well as soreness, swelling, and redness (inflammation) of the vagina.  This condition is treated with medicine. Medicines may be over-the-counter or prescription.  Take or apply over-the-counter and prescription medicines only as told by your health care provider.  Do not   douche. Do not have sex or use tampons until your health care provider approves.  Contact a health care provider if your symptoms do not get better with treatment or your symptoms go away and then return. This information is not intended to replace advice given to you by your health care provider. Make sure you discuss any questions you have with your health care  provider. Document Revised: 10/30/2018 Document Reviewed: 08/18/2017 Elsevier Patient Education  2021 [employer] Inc[employer]  
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
## 2020-04-29 LAB — GLUCOSE TOLERANCE, 2 HOURS W/ 1HR
Glucose, 1 hour: 140 mg/dL (ref 65–179)
Glucose, 2 hour: 110 mg/dL (ref 65–152)
Glucose, Fasting: 81 mg/dL (ref 65–91)

## 2020-04-29 LAB — CBC
Hematocrit: 35.2 % (ref 34.0–46.6)
Hemoglobin: 12 g/dL (ref 11.1–15.9)
MCH: 32.4 pg (ref 26.6–33.0)
MCHC: 34.1 g/dL (ref 31.5–35.7)
MCV: 95 fL (ref 79–97)
Platelets: 178 10*3/uL (ref 150–450)
RBC: 3.7 x10E6/uL — ABNORMAL LOW (ref 3.77–5.28)
RDW: 13 % (ref 11.7–15.4)
WBC: 10.2 10*3/uL (ref 3.4–10.8)

## 2020-04-29 LAB — HIV ANTIBODY (ROUTINE TESTING W REFLEX): HIV Screen 4th Generation wRfx: NONREACTIVE

## 2020-04-29 LAB — TSH: TSH: 1.93 u[IU]/mL (ref 0.450–4.500)

## 2020-04-29 LAB — RPR: RPR Ser Ql: NONREACTIVE

## 2020-05-01 LAB — CERVICOVAGINAL ANCILLARY ONLY
Bacterial Vaginitis (gardnerella): POSITIVE — AB
Candida Glabrata: NEGATIVE
Candida Vaginitis: POSITIVE — AB
Chlamydia: NEGATIVE
Comment: NEGATIVE
Comment: NEGATIVE
Comment: NEGATIVE
Comment: NEGATIVE
Comment: NEGATIVE
Comment: NORMAL
Neisseria Gonorrhea: NEGATIVE
Trichomonas: NEGATIVE

## 2020-05-02 ENCOUNTER — Ambulatory Visit: Payer: Medicaid Other

## 2020-05-04 ENCOUNTER — Ambulatory Visit (HOSPITAL_BASED_OUTPATIENT_CLINIC_OR_DEPARTMENT_OTHER): Payer: Medicaid Other

## 2020-05-04 ENCOUNTER — Encounter: Payer: Self-pay | Admitting: *Deleted

## 2020-05-04 ENCOUNTER — Ambulatory Visit: Payer: Medicaid Other | Attending: Obstetrics and Gynecology | Admitting: *Deleted

## 2020-05-04 ENCOUNTER — Other Ambulatory Visit: Payer: Self-pay

## 2020-05-04 ENCOUNTER — Encounter: Payer: Medicaid Other | Admitting: Obstetrics and Gynecology

## 2020-05-04 DIAGNOSIS — Z362 Encounter for other antenatal screening follow-up: Secondary | ICD-10-CM

## 2020-05-04 DIAGNOSIS — O09293 Supervision of pregnancy with other poor reproductive or obstetric history, third trimester: Secondary | ICD-10-CM | POA: Diagnosis not present

## 2020-05-04 DIAGNOSIS — Z3A32 32 weeks gestation of pregnancy: Secondary | ICD-10-CM | POA: Insufficient documentation

## 2020-05-04 DIAGNOSIS — O34219 Maternal care for unspecified type scar from previous cesarean delivery: Secondary | ICD-10-CM

## 2020-05-04 DIAGNOSIS — E669 Obesity, unspecified: Secondary | ICD-10-CM

## 2020-05-04 DIAGNOSIS — O0930 Supervision of pregnancy with insufficient antenatal care, unspecified trimester: Secondary | ICD-10-CM

## 2020-05-04 DIAGNOSIS — O99213 Obesity complicating pregnancy, third trimester: Secondary | ICD-10-CM | POA: Diagnosis not present

## 2020-05-04 DIAGNOSIS — O0933 Supervision of pregnancy with insufficient antenatal care, third trimester: Secondary | ICD-10-CM | POA: Insufficient documentation

## 2020-05-04 DIAGNOSIS — O09213 Supervision of pregnancy with history of pre-term labor, third trimester: Secondary | ICD-10-CM | POA: Diagnosis not present

## 2020-05-04 DIAGNOSIS — Z3480 Encounter for supervision of other normal pregnancy, unspecified trimester: Secondary | ICD-10-CM

## 2020-05-11 ENCOUNTER — Other Ambulatory Visit: Payer: Self-pay

## 2020-05-11 ENCOUNTER — Ambulatory Visit (INDEPENDENT_AMBULATORY_CARE_PROVIDER_SITE_OTHER): Payer: Medicaid Other | Admitting: Obstetrics

## 2020-05-11 ENCOUNTER — Encounter: Payer: Self-pay | Admitting: Obstetrics

## 2020-05-11 VITALS — BP 110/68 | HR 104 | Wt 220.4 lb

## 2020-05-11 DIAGNOSIS — Z3480 Encounter for supervision of other normal pregnancy, unspecified trimester: Secondary | ICD-10-CM | POA: Diagnosis not present

## 2020-05-11 DIAGNOSIS — O0930 Supervision of pregnancy with insufficient antenatal care, unspecified trimester: Secondary | ICD-10-CM

## 2020-05-11 DIAGNOSIS — R87613 High grade squamous intraepithelial lesion on cytologic smear of cervix (HGSIL): Secondary | ICD-10-CM

## 2020-05-11 NOTE — Progress Notes (Signed)
Pt reports fetal movement with occasional back pain and pressure. 

## 2020-05-11 NOTE — Progress Notes (Signed)
Subjective:  Denise Fleming is a 25 y.o. [id number] at [redacted]w[redacted]d being seen today for ongoing prenatal care.  She is currently monitored for the following issues for this low-risk pregnancy and has Supervision of other normal pregnancy, antepartum; Late prenatal care affecting pregnancy, antepartum; Red blood cell antibody positive; Obesity in pregnancy, antepartum; Thyroid enlargement; and History of C-section on their problem list.  Patient reports backache.  Contractions: Not present. Vag. Bleeding: None.  Movement: Present. Denies leaking of fluid.   The following portions of the patient's history were reviewed and updated as appropriate: allergies, current medications, past family history, past medical history, past social history, past surgical history and problem list. Problem list updated.  Objective:   Vitals:   05/11/20 0900  BP: 110/68  Pulse: (!) 104  Weight: 220 lb 6.4 oz (100 kg)    Fetal Status:     Movement: Present     General:  Alert, oriented and cooperative. Patient is in no acute distress.  Skin: Skin is warm and dry. No rash noted.   Cardiovascular: Normal heart rate noted  Respiratory: Normal respiratory effort, no problems with respiration noted  Abdomen: Soft, gravid, appropriate for gestational age. Pain/Pressure: Present     Pelvic:  Cervical exam deferred        Extremities: Normal range of motion.  Edema: None  Mental Status: Normal mood and affect. Normal behavior. Normal judgment and thought content.   Urinalysis:      Assessment and Plan:  Pregnancy: [id number] at [redacted]w[redacted]d  1. Supervision of other normal pregnancy, antepartum  2. Late prenatal care affecting pregnancy, antepartum  3. HGSIL (high grade squamous intraepithelial lesion) on Pap smear of cervix, antepartum - colposcopy done.  Repeat coplo with biopsies and ECC 3-4 months postpartum  Preterm labor symptoms and general obstetric precautions including but not limited to vaginal bleeding,  contractions, leaking of fluid and fetal movement were reviewed in detail with the patient. Please refer to After Visit Summary for other counseling recommendations.   Return in about 2 weeks (around 05/25/2020) for ROB.   Brock Bad, MD  05/11/20    Colposcopy Procedure Note  Indications: Pap smear 1 months ago showed: high-grade squamous intraepithelial neoplasia  (HGSIL-encompassing moderate and severe dysplasia). The prior pap showed unknown.  Prior cervical/vaginal disease: unknown. Prior cervical treatment: no treatment.  Procedure Details  The risks and benefits of the procedure and Written informed consent obtained.  A time-out was performed confirming the patient, procedure and allergy status  Speculum placed in vagina and excellent visualization of cervix achieved, cervix swabbed x 3 with acetic acid solution.  Findings: Cervix: acetowhite lesion(s) noted at 12 o'clock o'clock and punctation noted at 12 and 6 o'clock; SCJ visualized 360 degrees without lesions and no biopsies taken.   Vaginal inspection: normal without visible lesions. Vulvar colposcopy: vulvar colposcopy not performed.   Physical Exam   Specimens: None  Complications: none.  Plan: Repeat colposcopy postpartum  Brock Bad, MD 05/11/2020 9:52 AM

## 2020-05-25 ENCOUNTER — Other Ambulatory Visit: Payer: Self-pay

## 2020-05-25 ENCOUNTER — Encounter (HOSPITAL_COMMUNITY): Payer: Self-pay | Admitting: Family Medicine

## 2020-05-25 ENCOUNTER — Inpatient Hospital Stay (HOSPITAL_BASED_OUTPATIENT_CLINIC_OR_DEPARTMENT_OTHER): Payer: Medicaid Other

## 2020-05-25 ENCOUNTER — Inpatient Hospital Stay (HOSPITAL_COMMUNITY)
Admission: AD | Admit: 2020-05-25 | Discharge: 2020-05-25 | Disposition: A | Discharge status: Home / Self Care | Payer: Medicaid Other | Attending: Family Medicine | Admitting: Family Medicine

## 2020-05-25 DIAGNOSIS — O0930 Supervision of pregnancy with insufficient antenatal care, unspecified trimester: Secondary | ICD-10-CM

## 2020-05-25 DIAGNOSIS — O99891 Other specified diseases and conditions complicating pregnancy: Secondary | ICD-10-CM

## 2020-05-25 DIAGNOSIS — R102 Pelvic and perineal pain: Secondary | ICD-10-CM

## 2020-05-25 DIAGNOSIS — Z3A35 35 weeks gestation of pregnancy: Secondary | ICD-10-CM | POA: Diagnosis not present

## 2020-05-25 DIAGNOSIS — O34219 Maternal care for unspecified type scar from previous cesarean delivery: Secondary | ICD-10-CM

## 2020-05-25 DIAGNOSIS — O26893 Other specified pregnancy related conditions, third trimester: Secondary | ICD-10-CM | POA: Diagnosis not present

## 2020-05-25 DIAGNOSIS — Z3689 Encounter for other specified antenatal screening: Secondary | ICD-10-CM

## 2020-05-25 DIAGNOSIS — O09213 Supervision of pregnancy with history of pre-term labor, third trimester: Secondary | ICD-10-CM

## 2020-05-25 DIAGNOSIS — Z3480 Encounter for supervision of other normal pregnancy, unspecified trimester: Secondary | ICD-10-CM

## 2020-05-25 DIAGNOSIS — Z79899 Other long term (current) drug therapy: Secondary | ICD-10-CM | POA: Insufficient documentation

## 2020-05-25 DIAGNOSIS — R109 Unspecified abdominal pain: Secondary | ICD-10-CM

## 2020-05-25 DIAGNOSIS — O99213 Obesity complicating pregnancy, third trimester: Secondary | ICD-10-CM

## 2020-05-25 DIAGNOSIS — O0933 Supervision of pregnancy with insufficient antenatal care, third trimester: Secondary | ICD-10-CM

## 2020-05-25 DIAGNOSIS — O212 Late vomiting of pregnancy: Secondary | ICD-10-CM | POA: Diagnosis not present

## 2020-05-25 DIAGNOSIS — O26899 Other specified pregnancy related conditions, unspecified trimester: Secondary | ICD-10-CM

## 2020-05-25 DIAGNOSIS — Z7982 Long term (current) use of aspirin: Secondary | ICD-10-CM | POA: Diagnosis not present

## 2020-05-25 DIAGNOSIS — E669 Obesity, unspecified: Secondary | ICD-10-CM

## 2020-05-25 LAB — URINALYSIS, MICROSCOPIC (REFLEX)

## 2020-05-25 LAB — URINALYSIS, ROUTINE W REFLEX MICROSCOPIC
Glucose, UA: NEGATIVE mg/dL
Hgb urine dipstick: NEGATIVE
Ketones, ur: NEGATIVE mg/dL
Nitrite: NEGATIVE
Protein, ur: NEGATIVE mg/dL
Specific Gravity, Urine: 1.025 (ref 1.005–1.030)
pH: 6.5 (ref 5.0–8.0)

## 2020-05-25 MED ORDER — ACETAMINOPHEN 500 MG PO TABS
1000.0000 mg | ORAL_TABLET | Freq: Four times a day (QID) | ORAL | Status: DC | PRN
  Administered 2020-05-25: 1000 mg via ORAL
  Filled 2020-05-25: qty 2

## 2020-05-25 NOTE — MAU Note (Signed)
Pt reports she is having lower abd pain/cramping x3 days pain is getting worse. Reports some vomiting due to the pain. Good fetal movement felt. Reports some white vag discharge but no bleeding.

## 2020-05-25 NOTE — MAU Provider Note (Signed)
History     CSN: [id number]  Arrival date and time: 05/25/20 1046   Event Date/Time   First Provider Initiated Contact with Patient 05/25/20 1151      Chief Complaint  Patient presents with  . Abdominal Pain  . Emesis   25 y.o. [id number] @35 .0 wks presenting with LAP and pressure. Reports onset last night. Describes as constant pain and pressure in her central lower abdomen and vagina. Pain is worse when she stands and with fetal movement. Rates pain 7/10. Has not tried anything for it. Reports occasional ctx. Denies LOF or VB. Denies urinary sx. Feeling good FM.   OB History    Gravida  5   Para  4   Term  3   Preterm  1   AB  0   Living  4     SAB      IAB      Ectopic      Multiple      Live Births  4        Obstetric Comments  1st baby "premature"-[place], 9/10 2nd baby C/S-[place]         Past Medical History:  Diagnosis Date  . Medical history non-contributory     Past Surgical History:  Procedure Laterality Date  . CESAREAN SECTION      Family History  Problem Relation Age of Onset  . Healthy Mother     Social History   Tobacco Use  . Smoking status: Never Smoker  . Smokeless tobacco: Never Used  Vaping Use  . Vaping Use: Never used  Substance Use Topics  . Alcohol use: Never  . Drug use: Never    Allergies: No Known Allergies  No medications prior to admission.    Review of Systems  Gastrointestinal: Positive for abdominal pain and constipation.  Genitourinary: Positive for pelvic pain and vaginal pain. Negative for vaginal bleeding.   Physical Exam   Blood pressure 114/71, pulse 88, temperature 98.9 F (37.2 C), height 5\' 2"  (1.575 m), weight 100.7 kg, SpO2 99 %.  Physical Exam Vitals and nursing note reviewed. Exam conducted with a chaperone present.  Constitutional:      General: She is not in acute distress.    Appearance: Normal appearance.  HENT:     Head: Normocephalic and atraumatic.   Cardiovascular:     Rate and Rhythm: Normal rate.  Pulmonary:     Effort: Pulmonary effort is normal. No respiratory distress.  Abdominal:     Palpations: Abdomen is soft.     Tenderness: There is no abdominal tenderness.     Comments: gravid  Genitourinary:    Comments: VE: 1/thick/vtx Musculoskeletal:        General: Normal range of motion.     Cervical back: Normal range of motion.  Skin:    General: Skin is warm and dry.  Neurological:     General: No focal deficit present.     Mental Status: She is alert and oriented to person, place, and time.  Psychiatric:        Mood and Affect: Mood normal.        Behavior: Behavior normal.   EFM: 145 bpm, mod variability, + accels, no decels Toco: rare  Results for orders placed or performed during the hospital encounter of 05/25/20 (from the past 24 hour(s))  Urinalysis, Routine w reflex microscopic Urine, Clean Catch     Status: Abnormal   Collection Time: 05/25/20 11:14 AM  Result Value Ref  Range   Color, Urine YELLOW YELLOW   APPearance CLOUDY (A) CLEAR   Specific Gravity, Urine 1.025 1.005 - 1.030   pH 6.5 5.0 - 8.0   Glucose, UA NEGATIVE NEGATIVE mg/dL   Hgb urine dipstick NEGATIVE NEGATIVE   Bilirubin Urine SMALL (A) NEGATIVE   Ketones, ur NEGATIVE NEGATIVE mg/dL   Protein, ur NEGATIVE NEGATIVE mg/dL   Nitrite NEGATIVE NEGATIVE   Leukocytes,Ua TRACE (A) NEGATIVE  Urinalysis, Microscopic (reflex)     Status: Abnormal   Collection Time: 05/25/20 11:14 AM  Result Value Ref Range   RBC / HPF 0-5 0 - 5 RBC/hpf   WBC, UA 6-10 0 - 5 WBC/hpf   Bacteria, UA RARE (A) NONE SEEN   Squamous Epithelial / LPF 21-50 0 - 5   Mucus PRESENT    Limited US: normal MAU Course  Procedures Tylenol  MDM Labs ordered and reviewed. Pain improved. Likely MSK. No signs of labor. Discussed with pt plan for delivery. She is undecided at this time as she had a CS with her son and had a vaginal delivery and SD with her daughter who is now  disabled. She would like to know the fetal weight before deciding. I recommend a growth [place] for EFW in the next 1-2 weeks and appt with MD to discuss MOD. Stable for discharge home.   Assessment and Plan  [redacted] weeks gestation Reactive NST Pelvic pain Discharge home Follow up at [facility] tomorrow  PTL precautions Tylenol prn  Allergies as of 05/25/2020   No Known Allergies     Medication List    STOP taking these medications   terconazole 0.4 % vaginal cream Commonly known as: Terazol 7     TAKE these medications   aspirin EC 81 MG tablet Take 1 tablet (81 mg total) by mouth daily.   cyclobenzaprine 10 MG tablet Commonly known as: FLEXERIL Take 1 tablet (10 mg total) by mouth 2 (two) times daily as needed for muscle spasms.   ondansetron 4 MG tablet Commonly known as: ZOFRAN Take 1 tablet (4 mg total) by mouth every 6 (six) hours.   pantoprazole 20 MG tablet Commonly known as: Protonix Take 1 tablet (20 mg total) by mouth daily.   Prenatal 27-1 MG Tabs Take 1 tablet by mouth daily.   TYLENOL PO Take 1 tablet by mouth every 4 (four) hours as needed (pain/headache). Pt is unsure if tylenol is 325 or 500       Donette Larry, CNM 05/25/2020, 2:47 PM

## 2020-05-25 NOTE — Discharge Instructions (Signed)

## 2020-05-26 ENCOUNTER — Ambulatory Visit (INDEPENDENT_AMBULATORY_CARE_PROVIDER_SITE_OTHER): Payer: Medicaid Other | Admitting: Women's Health

## 2020-05-26 ENCOUNTER — Other Ambulatory Visit: Payer: Self-pay

## 2020-05-26 VITALS — BP 98/63 | HR 103 | Temp 98.3°F | Wt 221.6 lb

## 2020-05-26 DIAGNOSIS — R768 Other specified abnormal immunological findings in serum: Secondary | ICD-10-CM

## 2020-05-26 DIAGNOSIS — O26899 Other specified pregnancy related conditions, unspecified trimester: Secondary | ICD-10-CM | POA: Insufficient documentation

## 2020-05-26 DIAGNOSIS — Z23 Encounter for immunization: Secondary | ICD-10-CM | POA: Diagnosis not present

## 2020-05-26 DIAGNOSIS — O26843 Uterine size-date discrepancy, third trimester: Secondary | ICD-10-CM

## 2020-05-26 DIAGNOSIS — Z3480 Encounter for supervision of other normal pregnancy, unspecified trimester: Secondary | ICD-10-CM | POA: Diagnosis not present

## 2020-05-26 DIAGNOSIS — R87613 High grade squamous intraepithelial lesion on cytologic smear of cervix (HGSIL): Secondary | ICD-10-CM | POA: Insufficient documentation

## 2020-05-26 DIAGNOSIS — R102 Pelvic and perineal pain: Secondary | ICD-10-CM

## 2020-05-26 DIAGNOSIS — Z8759 Personal history of other complications of pregnancy, childbirth and the puerperium: Secondary | ICD-10-CM | POA: Insufficient documentation

## 2020-05-26 DIAGNOSIS — O99210 Obesity complicating pregnancy, unspecified trimester: Secondary | ICD-10-CM

## 2020-05-26 DIAGNOSIS — K5900 Constipation, unspecified: Secondary | ICD-10-CM | POA: Insufficient documentation

## 2020-05-26 DIAGNOSIS — Z98891 History of uterine scar from previous surgery: Secondary | ICD-10-CM

## 2020-05-26 DIAGNOSIS — O99613 Diseases of the digestive system complicating pregnancy, third trimester: Secondary | ICD-10-CM

## 2020-05-26 DIAGNOSIS — Z3A35 35 weeks gestation of pregnancy: Secondary | ICD-10-CM

## 2020-05-26 DIAGNOSIS — E049 Nontoxic goiter, unspecified: Secondary | ICD-10-CM

## 2020-05-26 MED ORDER — DOCUSATE SODIUM 100 MG PO CAPS: 100.0000 mg | ORAL_CAPSULE | Freq: Two times a day (BID) | ORAL | 1 refills | Status: AC

## 2020-05-26 NOTE — Progress Notes (Signed)
Subjective:  Denise Fleming is a 25 y.o. [id number] at [redacted]w[redacted]d being seen today for ongoing prenatal care.  She is currently monitored for the following issues for this high-risk pregnancy and has Supervision of other normal pregnancy, antepartum; Late prenatal care affecting pregnancy, antepartum; Red blood cell antibody positive; Obesity in pregnancy, antepartum; Thyroid enlargement; History of C-section; History of shoulder dystocia in prior pregnancy; HSIL (high grade squamous intraepithelial lesion) on Pap smear of cervix; Pelvic pain in pregnancy; Uterine size-date discrepancy in third trimester; and Constipation during pregnancy in third trimester on their problem list.  Patient reports no complaints.  Contractions: Irregular. Vag. Bleeding: None.  Movement: Present. Denies leaking of fluid.   The following portions of the patient's history were reviewed and updated as appropriate: allergies, current medications, past family history, past medical history, past social history, past surgical history and problem list. Problem list updated.  Objective:   Vitals:   05/26/20 0852  BP: 98/63  Pulse: (!) 103  Temp: 98.3 F (36.8 C)  Weight: 221 lb 9.6 oz (100.5 kg)    Fetal Status: Fetal Heart Rate (bpm): 139 Fundal Height: 42 cm Movement: Present     General:  Alert, oriented and cooperative. Patient is in no acute distress.  Skin: Skin is warm and dry. No rash noted.   Cardiovascular: Normal heart rate noted  Respiratory: Normal respiratory effort, no problems with respiration noted  Abdomen: Soft, gravid, appropriate for gestational age. Pain/Pressure: Present     Pelvic: Vag. Bleeding: None Vag D/C Character: White   Cervical exam deferred        Extremities: Normal range of motion.  Edema: None  Mental Status: Normal mood and affect. Normal behavior. Normal judgment and thought content.   Urinalysis:      Assessment and Plan:  Pregnancy: [id number] at [redacted]w[redacted]d  1. History of shoulder  dystocia in prior pregnancy - [place] MFM OB FOLLOW UP; Future  2. Supervision of other normal pregnancy, antepartum -Tdap offered today, pt accepts -peds list given -reviewed labs from last visit  3. Red blood cell antibody positive -negative antibodies at [facility] 03/22/2020  4. Obesity in pregnancy, antepartum -COVID+ 04/11/2020, will schedule for antenatal testing given comorbid obesity  5. Thyroid enlargement -TSH normal 04/28/2020  6. History of C-section -will have growth [place] within next week (ordered) and then meet with MD next visit to discuss MOD given hx of shoulder dystocia with arm disability of child after delivery and hx of C/S for LGA  8. Pelvic pain in pregnancy - AMB referral to rehabilitation -pubic symphysis pain on palpation, worse with movement, was evaluated in MAU yesterday and pain found to be musculoskeletal  9. [redacted] weeks gestation of pregnancy  10. Uterine size-date discrepancy in third trimester -FH 5 -scheduling growth [place]  11. Constipation during pregnancy in third trimester - pt reports is still passing stool, but is small amounts and difficult to pass - safe meds in pregnancy list given - docusate sodium (COLACE) 100 MG capsule; Take 1 capsule (100 mg total) by mouth 2 (two) times daily.  Dispense: 60 capsule; Refill: 1   Preterm labor symptoms and general obstetric precautions including but not limited to vaginal bleeding, contractions, leaking of fluid and fetal movement were reviewed in detail with the patient. I discussed the assessment and treatment plan with the patient. The patient was provided an opportunity to ask questions and all were answered. The patient agreed with the plan and demonstrated an understanding of  the instructions. The patient was advised to call back or seek an in-person office evaluation/go to MAU at [facility] for any urgent or concerning symptoms. Please refer to After Visit Summary for other  counseling recommendations.  Return in about 1 week (around 06/02/2020) for see below.  in-person HOB/MD ONLY (must be MD that delivers in hospital, not fellow) to discuss C/S vs. TOLAC/GBS/cultures, needs growth [place] prior to this visit and needs to get scheduled with MFM for antenatal testing given COVID+ dx in 3rd trimester, needs appt for pelvic PT   Elena Cothern, Odie Sera, NP

## 2020-05-26 NOTE — Patient Instructions (Addendum)
Maternity Assessment Unit (MAU)  The Maternity Assessment Unit (MAU) is located at the [facility] and [place] [facility]. The address is: [address], [place], [place], [place] [address]. Please see map below for additional directions.    The Maternity Assessment Unit is designed to help you during your pregnancy, and for up to 6 weeks after delivery, with any pregnancy- or postpartum-related emergencies, if you think you are in labor, or if your water has broken. For example, if you experience nausea and vomiting, vaginal bleeding, severe abdominal or pelvic pain, elevated blood pressure or other problems related to your pregnancy or postpartum time, please come to the Maternity Assessment Unit for assistance.        Preterm Labor The normal length of a pregnancy is 39-41 weeks. Preterm labor is when labor starts before 37 completed weeks of pregnancy. Babies who are born prematurely and survive may not be fully developed and may be at an increased risk for long-term problems such as cerebral palsy, developmental delays, and vision and hearing problems. Babies who are born too early may have problems soon after birth. Problems may include regulating blood sugar, body temperature, heart rate, and breathing rate. These babies often have trouble with feeding. The risk of having problems is highest for babies who are born before 44 weeks of pregnancy. What are the causes? The exact cause of this condition is not known. What increases the risk? You are more likely to have preterm labor if you have certain risk factors that relate to your medical history, problems with present and past pregnancies, and lifestyle factors. Medical history  You have abnormalities of the uterus, including a short cervix.  You have STIs (sexually transmitted infections), or other infections of the urinary tract and the vagina.  You have chronic illnesses, such as blood clotting problems,  diabetes, or high blood pressure.  You are overweight or underweight. Present and past pregnancies  You have had preterm labor before.  You are pregnant with twins or other multiples.  You have been diagnosed with a condition in which the placenta covers your cervix (placenta previa).  You waited less than 6 months between giving birth and becoming pregnant again.  Your unborn baby has some abnormalities.  You have vaginal bleeding during pregnancy.  You became pregnant through in vitro fertilization (IVF). Lifestyle and environmental factors  You use tobacco products.  You drink alcohol.  You use street drugs.  You have stress and no social support.  You experience domestic violence.  You are exposed to certain chemicals or environmental pollutants. Other factors  You are younger than age 110 or older than age 19. What are the signs or symptoms? Symptoms of this condition include:  Cramps similar to those that can happen during a menstrual period. The cramps may happen with diarrhea.  Pain in the abdomen or lower back.  Regular contractions that may feel like tightening of the abdomen.  A feeling of increased pressure in the pelvis.  Increased watery or bloody mucus discharge from the vagina.  Water breaking (ruptured amniotic sac). How is this diagnosed? This condition is diagnosed based on:  Your medical history and a physical exam.  A pelvic exam.  An ultrasound.  Monitoring your uterus for contractions.  Other tests, including: ? A swab of the cervix to check for a chemical called fetal fibronectin. ? Urine tests. How is this treated? Treatment for this condition depends on the length of your pregnancy, your  condition, and the health of your baby. Treatment may include:  Taking medicines, such as: ? Hormone medicines. These may be given early in pregnancy to help support the pregnancy. ? Medicines to stop contractions. ? Medicines to help mature  the baby's lungs. These may be prescribed if the risk of delivery is high. ? Medicines to prevent your baby from developing cerebral palsy.  Bed rest. If the labor happens before 34 weeks of pregnancy, you may need to stay in the hospital.  Delivery of the baby. Follow these instructions at home:  Do not use any products that contain nicotine or tobacco, such as cigarettes, e-cigarettes, and chewing tobacco. If you need help quitting, ask your health care provider.  Do not drink alcohol.  Take over-the-counter and prescription medicines only as told by your health care provider.  Rest as told by your health care provider.  Return to your normal activities as told by your health care provider. Ask your health care provider what activities are safe for you.  Keep all follow-up visits as told by your health care provider. This is important.   How is this prevented? To increase your chance of having a full-term pregnancy:  Do not use street drugs or medicines that have not been prescribed to you during your pregnancy.  Talk with your health care provider before taking any herbal supplements, even if you have been taking them regularly.  Make sure you gain a healthy amount of weight during your pregnancy.  Watch for infection. If you think that you might have an infection, get it checked right away. Symptoms of infection may include: ? Fever. ? Abnormal vaginal discharge or discharge that smells bad. ? Pain or burning with urination. ? Needing to urinate urgently. ? Frequently urinating or passing small amounts of urine frequently. ? Blood in your urine. ? Urine that smells bad or unusual.  Tell your health care provider if you have had preterm labor before. Contact a health care provider if:  You think you are going into preterm labor.  You have signs or symptoms of preterm labor.  You have symptoms of infection. Get help right away if:  You are having regular, painful  contractions every 5 minutes or less.  Your water breaks. Summary  Preterm labor is labor that starts before you reach 37 weeks of pregnancy.  Delivering your baby early increases your baby's risk of developing lifelong problems.  The exact cause of preterm labor is unknown. However, having an abnormal uterus, an STI (sexually transmitted infection), or vaginal bleeding during pregnancy increases your risk for preterm labor.  Keep all follow-up visits as told by your health care provider. This is important.  Contact a health care provider if you have signs or symptoms of preterm labor. This information is not intended to replace advice given to you by your health care provider. Make sure you discuss any questions you have with your health care provider. Document Revised: 05/04/2019 Document Reviewed: 05/04/2019 Elsevier Patient Education  2021 Elsevier Inc.       AREA PEDIATRIC/FAMILY PRACTICE PHYSICIANS  [facility] [address] [place], [place] [address] Phone - [phone]   Fax - [phone]  JACK AMOS [address] [place], [place]  [address] Phone - [phone]   Fax - [phone]  [facility] [address] [place], [place]  [address] Phone - [phone]   Fax - [phone]  [facility] [address] [place],  [place]  [address] Phone - [phone]   Fax - [phone]  [facility] [address] [place], [place]  [address] Phone - [phone]   Fax - [phone]  [facility] [address] [place], [place]  [address] Phone - [phone]   Fax - [phone]  [facility] [place] [address] [place], [place]  [address] Phone - [phone]   Fax - [phone]  [facility] AT [facility] [address] [place], [address] [place], [place]  [address] Phone - [phone]   Fax -  [phone]  [facility] AT [facility] [address] [place], [place]  [address] Phone - [phone]   Fax - [phone] [facility] AT [place] [address] [place], [place]  [address] Phone - [phone]   Fax - [phone]  [facility] [address] [place], [place]  [address] Phone - [phone]   Fax - [phone]  [facility] [address] [place], [place]  [address] Phone - [phone]   Fax - [phone]  [facility] [place] [address] [place], [place]  [address] Phone - [phone]   Fax - [phone]  [facility] [address] [place], [place]  [address] Phone - [phone]  [facility] [address] [place], [place]  [address] Phone - [phone]   Fax - [phone]  [facility] [address] [place], [place]  [address] Phone - [phone]   Fax - [phone]  [facility] [address] [place], [place]  [address] Phone - [phone]   Fax - [phone]  [facility] [address] [place], [place]  [address] Phone - [phone]   Fax - [phone]   [facility] [address] [place], [place]  [address] Phone - [phone]   Fax - [phone]  [facility] [address] [place], [place]  [address] Phone - [phone]   Fax - [phone]  DAVID RUBIN [address] [place], [place]  [address] Phone - [phone]   Fax - [phone]  [facility] [address] [place], [place]  [address] Phone - [phone]   Fax - [phone]  [facility] [address] [place] [address] [place], [place]  [address] Phone - [phone]   Fax - [phone] Gerarda Fraction [address] [place], [place]  [address] Phone - [phone]   Fax -  [phone]  [facility] [address] [place], [place]  [address] Phone - [phone]   Fax - [phone]  [facility] [place] [address] [place], [place]  [address] Phone - [phone]   Fax - [phone]         Tdap (Tetanus, Diphtheria, Pertussis) Vaccine: What You Need to Know 1. Why get vaccinated? Tdap vaccine can prevent tetanus, diphtheria, and pertussis. Diphtheria and pertussis spread from person to person. Tetanus enters the body through cuts or wounds.  TETANUS (T) causes painful stiffening of the muscles. Tetanus can lead to serious health problems, including being unable to open the mouth, having trouble swallowing and breathing, or death.  DIPHTHERIA (D) can lead to difficulty breathing, heart failure, paralysis, or death.  PERTUSSIS (aP), also known as "whooping cough," can cause uncontrollable, violent coughing that makes it hard to breathe, eat, or  drink. Pertussis can be extremely serious especially in babies and young children, causing pneumonia, convulsions, brain damage, or death. In teens and adults, it can cause weight loss, loss of bladder control, passing out, and rib fractures from severe coughing. 2. Tdap vaccine Tdap is only for children 7 years and older, adolescents, and adults.  Adolescents should receive a single dose of Tdap, preferably at age 2 or 12 years. Pregnant people should get a dose of Tdap during every pregnancy, preferably during the early part of the third trimester, to help protect the newborn from pertussis. Infants are most at risk for severe, life-threatening complications from pertussis. Adults who have never received Tdap should get a dose of Tdap. Also, adults should receive a booster dose of either Tdap or Td (a different vaccine that protects against tetanus and diphtheria but not pertussis) every 10 years, or after 5 years in the case of a severe or dirty wound or  burn. Tdap may be given at the same time as other vaccines. 3. Talk with your health care provider Tell your vaccine provider if the person getting the vaccine:  Has had an allergic reaction after a previous dose of any vaccine that protects against tetanus, diphtheria, or pertussis, or has any severe, life-threatening allergies  Has had a coma, decreased level of consciousness, or prolonged seizures within 7 days after a previous dose of any pertussis vaccine (DTP, DTaP, or Tdap)  Has seizures or another nervous system problem  Has ever had Guillain-Barr Syndrome (also called "GBS")  Has had severe pain or swelling after a previous dose of any vaccine that protects against tetanus or diphtheria In some cases, your health care provider may decide to postpone Tdap vaccination until a future visit. People with minor illnesses, such as a cold, may be vaccinated. People who are moderately or severely ill should usually wait until they recover before getting Tdap vaccine.  Your health care provider can give you more information. 4. Risks of a vaccine reaction  Pain, redness, or swelling where the shot was given, mild fever, headache, feeling tired, and nausea, vomiting, diarrhea, or stomachache sometimes happen after Tdap vaccination. People sometimes faint after medical procedures, including vaccination. Tell your provider if you feel dizzy or have vision changes or ringing in the ears.  As with any medicine, there is a very remote chance of a vaccine causing a severe allergic reaction, other serious injury, or death. 5. What if there is a serious problem? An allergic reaction could occur after the vaccinated person leaves the clinic. If you see signs of a severe allergic reaction (hives, swelling of the face and throat, difficulty breathing, a fast heartbeat, dizziness, or weakness), call 9-1-1 and get the person to the nearest hospital. For other signs that concern you, call your health care  provider.  Adverse reactions should be reported to the Vaccine Adverse Event Reporting System (VAERS). Your health care provider will usually file this report, or you can do it yourself. Visit the VAERS website at www.vaers.LAgents.no or call 430-861-1064. VAERS is only for reporting reactions, and VAERS staff members do not give medical advice. 6. The National Vaccine Injury Compensation Program The [employer] Vaccine Injury Compensation Program (VICP) is a federal program that was created to compensate people who may have been injured by certain vaccines. Claims regarding alleged injury or death due to vaccination have a time limit for filing, which may be as short as two years. Visit the VICP website at SpiritualWord.at or call (959) 699-2076  to learn about the program and about filing a claim. 7. How can I learn more?  Ask your health care provider.  Call your local or state health department.  Visit the website of the Food and Drug Administration (FDA) for vaccine package inserts and additional information at FinderList.no.  Contact the Centers for Disease Control and Prevention (CDC): ? Call 873-279-7211 (1-800-CDC-INFO) or ? Visit CDC's website at PicCapture.uy. Vaccine Information Statement Tdap (Tetanus, Diphtheria, Pertussis) Vaccine (11/19/2019) This information is not intended to replace advice given to you by your health care provider. Make sure you discuss any questions you have with your health care provider. Document Revised: 12/15/2019 Document Reviewed: 12/15/2019 Elsevier Patient Education  2021 Elsevier Inc.                        Safe Medications in Pregnancy    Acne: Benzoyl Peroxide Salicylic Acid  Backache/Headache: Tylenol: 2 regular strength every 4 hours OR              2 Extra strength every 6 hours  Colds/Coughs/Allergies: Benadryl (alcohol free) 25 mg every 6 hours as needed Breath right  strips Claritin Cepacol throat lozenges Chloraseptic throat spray Cold-Eeze- up to three times per day Cough drops, alcohol free Flonase (by prescription only) Guaifenesin Mucinex Robitussin DM (plain only, alcohol free) Saline nasal spray/drops Sudafed (pseudoephedrine) & Actifed ** use only after [redacted] weeks gestation and if you do not have high blood pressure Tylenol Vicks Vaporub Zinc lozenges Zyrtec   Constipation: Colace* Ducolax suppositories Fleet enema Glycerin suppositories Metamucil Milk of magnesia Miralax Senokot Smooth move tea  Diarrhea: Kaopectate Imodium A-D  *NO pepto Bismol  Hemorrhoids: Anusol Anusol HC Preparation H Tucks  Indigestion: Tums Maalox Mylanta Zantac  Pepcid  Insomnia: Benadryl (alcohol free) 25mg  every 6 hours as needed Tylenol PM Unisom, no Gelcaps  Leg Cramps: Tums MagGel  Nausea/Vomiting:  Bonine Dramamine Emetrol Ginger extract Sea bands Meclizine  Nausea medication to take during pregnancy:  Unisom (doxylamine succinate 25 mg tablets) Take one tablet daily at bedtime. If symptoms are not adequately controlled, the dose can be increased to a maximum recommended dose of two tablets daily (1/2 tablet in the morning, 1/2 tablet mid-afternoon and one at bedtime). Vitamin B6 100mg  tablets. Take one tablet twice a day (up to 200 mg per day).  Skin Rashes: Aveeno products Benadryl cream or 25mg  every 6 hours as needed Calamine Lotion 1% cortisone cream  Yeast infection: Gyne-lotrimin 7 Monistat 7   **If taking multiple medications, please check labels to avoid duplicating the same active ingredients **take medication as directed on the label ** Do not exceed 4000 mg of tylenol in 24 hours **Do not take medications that contain aspirin or ibuprofen

## 2020-05-26 NOTE — Addendum Note (Signed)
Addended by: Clovis Pu on: 05/26/2020 10:03 AM   Modules accepted: Orders

## 2020-05-30 ENCOUNTER — Ambulatory Visit: Payer: Medicaid Other | Attending: Obstetrics and Gynecology

## 2020-05-30 ENCOUNTER — Inpatient Hospital Stay (HOSPITAL_COMMUNITY)
Admission: AD | Admit: 2020-05-30 | Discharge: 2020-05-30 | Disposition: A | Discharge status: Home / Self Care | Payer: Medicaid Other | Attending: Obstetrics and Gynecology | Admitting: Obstetrics and Gynecology

## 2020-05-30 ENCOUNTER — Encounter (HOSPITAL_COMMUNITY): Payer: Self-pay | Admitting: Obstetrics and Gynecology

## 2020-05-30 ENCOUNTER — Ambulatory Visit: Payer: Medicaid Other | Admitting: *Deleted

## 2020-05-30 ENCOUNTER — Encounter: Payer: Self-pay | Admitting: *Deleted

## 2020-05-30 ENCOUNTER — Other Ambulatory Visit: Payer: Self-pay

## 2020-05-30 DIAGNOSIS — O99210 Obesity complicating pregnancy, unspecified trimester: Secondary | ICD-10-CM | POA: Insufficient documentation

## 2020-05-30 DIAGNOSIS — Z8759 Personal history of other complications of pregnancy, childbirth and the puerperium: Secondary | ICD-10-CM | POA: Diagnosis not present

## 2020-05-30 DIAGNOSIS — Z3480 Encounter for supervision of other normal pregnancy, unspecified trimester: Secondary | ICD-10-CM | POA: Insufficient documentation

## 2020-05-30 DIAGNOSIS — O368331 Maternal care for abnormalities of the fetal heart rate or rhythm, third trimester, fetus 1: Secondary | ICD-10-CM | POA: Insufficient documentation

## 2020-05-30 DIAGNOSIS — O99213 Obesity complicating pregnancy, third trimester: Secondary | ICD-10-CM

## 2020-05-30 DIAGNOSIS — O99891 Other specified diseases and conditions complicating pregnancy: Secondary | ICD-10-CM

## 2020-05-30 DIAGNOSIS — O2243 Hemorrhoids in pregnancy, third trimester: Secondary | ICD-10-CM | POA: Insufficient documentation

## 2020-05-30 DIAGNOSIS — Z7982 Long term (current) use of aspirin: Secondary | ICD-10-CM | POA: Diagnosis not present

## 2020-05-30 DIAGNOSIS — E669 Obesity, unspecified: Secondary | ICD-10-CM | POA: Diagnosis not present

## 2020-05-30 DIAGNOSIS — O34219 Maternal care for unspecified type scar from previous cesarean delivery: Secondary | ICD-10-CM | POA: Diagnosis not present

## 2020-05-30 DIAGNOSIS — Z3A35 35 weeks gestation of pregnancy: Secondary | ICD-10-CM | POA: Diagnosis not present

## 2020-05-30 DIAGNOSIS — R87613 High grade squamous intraepithelial lesion on cytologic smear of cervix (HGSIL): Secondary | ICD-10-CM

## 2020-05-30 DIAGNOSIS — O0930 Supervision of pregnancy with insufficient antenatal care, unspecified trimester: Secondary | ICD-10-CM | POA: Diagnosis present

## 2020-05-30 DIAGNOSIS — O09213 Supervision of pregnancy with history of pre-term labor, third trimester: Secondary | ICD-10-CM

## 2020-05-30 DIAGNOSIS — O0933 Supervision of pregnancy with insufficient antenatal care, third trimester: Secondary | ICD-10-CM

## 2020-05-30 DIAGNOSIS — Z3689 Encounter for other specified antenatal screening: Secondary | ICD-10-CM

## 2020-05-30 DIAGNOSIS — R102 Pelvic and perineal pain: Secondary | ICD-10-CM | POA: Diagnosis not present

## 2020-05-30 MED ORDER — HYDROCORTISONE (PERIANAL) 2.5 % EX CREA
TOPICAL_CREAM | Freq: Once | CUTANEOUS | Status: AC
  Administered 2020-05-30: 1 via RECTAL
  Filled 2020-05-30: qty 28.35

## 2020-05-30 MED ORDER — HYDROCORTISONE (PERIANAL) 2.5 % EX CREA: TOPICAL_CREAM | Freq: Two times a day (BID) | CUTANEOUS | 0 refills | Status: DC

## 2020-05-30 MED ORDER — LIDOCAINE HCL URETHRAL/MUCOSAL 2 % EX GEL
1.0000 "application " | Freq: Once | CUTANEOUS | Status: AC
  Administered 2020-05-30: 1 via TOPICAL
  Filled 2020-05-30: qty 5

## 2020-05-30 NOTE — ED Triage Notes (Addendum)
Pt states she is 35 weeks 5 days pregnant and c/o hemorrhoids x 3 days.  Unable to sit due to pain.  PA notified of pt for MSE for MAU.

## 2020-05-30 NOTE — MAU Provider Note (Signed)
History     CSN: [id number]  Arrival date and time: 05/30/20 6203   None     Chief Complaint  Patient presents with  . Hemorrhoids   HPI Denise Fleming is a 25 y.o. [id number] at [redacted]w[redacted]d who presents to MAU from [facility] with chief complaint of hemorrhoids. This is a new problem, onset three days ago. Patient was recently evaluated in MAU for constipation and prescribed Colace but states she has not picked up her medicine. Her hemorroid pain is significant and prevents her from sleeping, sitting. She is unable to sit down on arrival to MAU and states she cannot allow a physical exam until she receives pain medicine. She is bent over MAU stretcher in exam room, moaning and shaking her head on arrival.   She denies lower abdominal pain, vaginal bleeding, decreased fetal movement.   She receives [facility].   OB History    Gravida  5   Para  4   Term  3   Preterm  1   AB  0   Living  4     SAB      IAB      Ectopic      Multiple      Live Births  4        Obstetric Comments  1st baby "premature"-[place], [place] 3rd baby C/S[place]         Past Medical History:  Diagnosis Date  . Medical history non-contributory     Past Surgical History:  Procedure Laterality Date  . CESAREAN SECTION      Family History  Problem Relation Age of Onset  . Healthy Mother     Social History   Tobacco Use  . Smoking status: Never Smoker  . Smokeless tobacco: Never Used  Vaping Use  . Vaping Use: Never used  Substance Use Topics  . Alcohol use: Never  . Drug use: Never    Allergies: No Known Allergies  Medications Prior to Admission  Medication Sig Dispense Refill Last Dose  . Acetaminophen (TYLENOL PO) Take 1 tablet by mouth every 4 (four) hours as needed (pain/headache). Pt is unsure if tylenol is 325 or 500   05/29/2020 at Unknown time  . aspirin EC 81 MG tablet Take 1 tablet (81 mg total) by mouth daily. (Patient not taking: Reported  on 05/26/2020) 60 tablet 2   . cyclobenzaprine (FLEXERIL) 10 MG tablet Take 1 tablet (10 mg total) by mouth 2 (two) times daily as needed for muscle spasms. (Patient not taking: No sig reported) 20 tablet 0   . docusate sodium (COLACE) 100 MG capsule Take 1 capsule (100 mg total) by mouth 2 (two) times daily. 60 capsule 1   . ondansetron (ZOFRAN) 4 MG tablet Take 1 tablet (4 mg total) by mouth every 6 (six) hours. (Patient not taking: No sig reported) 12 tablet 0   . pantoprazole (PROTONIX) 20 MG tablet Take 1 tablet (20 mg total) by mouth daily. (Patient not taking: No sig reported) 30 tablet 1   . Prenatal 27-1 MG TABS Take 1 tablet by mouth daily. (Patient not taking: No sig reported) 30 tablet 12     Review of Systems  Gastrointestinal: Positive for constipation and rectal pain.  All other systems reviewed and are negative.  Physical Exam   Blood pressure 138/70, pulse (!) 112, temperature 98.6 F (37 C), temperature source Oral, resp. rate 20, SpO2 100 %.  Physical Exam Vitals and nursing note  reviewed. Exam conducted with a chaperone present.  Constitutional:      General: She is in acute distress.     Appearance: Normal appearance.  Cardiovascular:     Rate and Rhythm: Tachycardia present.     Pulses: Normal pulses.     Comments: Consistent with patient baseline as evidenced by recorded vitals in chart Pulmonary:     Effort: Pulmonary effort is normal.     Breath sounds: Normal breath sounds.  Abdominal:     Comments: Gravid  Genitourinary:    Rectum: External hemorrhoid present.     Comments: Grade 3 external hemorrhoid. Not bleeding, not thrombosed. 1.5 cm Skin:    Capillary Refill: Capillary refill takes less than 2 seconds.  Neurological:     Mental Status: She is alert and oriented to person, place, and time.     MAU Course  Procedures  --Patient able to sit down and reposition independently after application of Lidocaine. Consented to exam by CNM. Uncomplicated  hemorrhoid. Not thrombosed. Very soft and pliable. Reduced by CNM s/p patient consent. Patient endorses immediate improvement in discomfort  --Reactive tracing: baseline 140, mod var, + 15 x 15 accels, no decels. Irregular contractions on toco. Cervix 1/thick, unchanged from exam 05/25/2020.  Meds ordered this encounter  Medications  . lidocaine (XYLOCAINE) 2 % jelly 1 application  . hydrocortisone (ANUSOL-HC) 2.5 % rectal cream  . hydrocortisone (ANUSOL-HC) 2.5 % rectal cream    Sig: Place rectally 2 (two) times daily for 5 days.    Dispense:  30 g    Refill:  0    Order Specific Question:   Supervising Provider    Answer:   Gotha Bing [id number]   Patient Vitals for the past 24 hrs:  BP Temp Temp src Pulse Resp SpO2  05/30/20 0950 101/62 98 F (36.7 C) Oral 91 18 98 %  05/30/20 0844 (!) 102/53 -- -- 98 -- 100 %  05/30/20 0828 138/70 98.6 F (37 C) Oral (!) 112 20 100 %  05/30/20 0747 121/85 99.2 F (37.3 C) -- (!) 107 (!) 22 100 %   Assessment and Plan  --25 y.o. [id number] at [redacted]w[redacted]d  --Reactive tracing --Cervix 1cm c/w previous exam, not laboring --Uncomplicated Grade 3 hemorrhoid --Emphasized importance of taking Colace as previously prescribed --Discharge home in stable condition  Calvert Cantor, CNM 05/30/2020, 11:41 AM

## 2020-05-30 NOTE — Discharge Instructions (Signed)
Hemorrhoids Hemorrhoids are swollen veins that may develop:  In the butt (rectum). These are called internal hemorrhoids.  Around the opening of the butt (anus). These are called external hemorrhoids. Hemorrhoids can cause pain, itching, or bleeding. Most of the time, they do not cause serious problems. They usually get better with diet changes, lifestyle changes, and other home treatments. What are the causes? This condition may be caused by:  Having trouble pooping (constipation).  Pushing hard (straining) to poop.  Watery poop (diarrhea).  Pregnancy.  Being very overweight (obese).  Sitting for long periods of time.  Heavy lifting or other activity that causes you to strain.  Anal sex.  Riding a bike for a long period of time. What are the signs or symptoms? Symptoms of this condition include:  Pain.  Itching or soreness in the butt.  Bleeding from the butt.  Leaking poop.  Swelling in the area.  One or more lumps around the opening of your butt. How is this diagnosed? A doctor can often diagnose this condition by looking at the affected area. The doctor may also:  Do an exam that involves feeling the area with a gloved hand (digital rectal exam).  Examine the area inside your butt using a small tube (anoscope).  Order blood tests. This may be done if you have lost a lot of blood.  Have you get a test that involves looking inside the colon using a flexible tube with a camera on the end (sigmoidoscopy or colonoscopy). How is this treated? This condition can usually be treated at home. Your doctor may tell you to change what you eat, make lifestyle changes, or try home treatments. If these do not help, procedures can be done to remove the hemorrhoids or make them smaller. These may involve:  Placing rubber bands at the base of the hemorrhoids to cut off their blood supply.  Injecting medicine into the hemorrhoids to shrink them.  Shining a type of light  energy onto the hemorrhoids to cause them to fall off.  Doing surgery to remove the hemorrhoids or cut off their blood supply. Follow these instructions at home: Eating and drinking  Eat foods that have a lot of fiber in them. These include whole grains, beans, nuts, fruits, and vegetables.  Ask your doctor about taking products that have added fiber (fibersupplements).  Reduce the amount of fat in your diet. You can do this by: ? Eating low-fat dairy products. ? Eating less red meat. ? Avoiding processed foods.  Drink enough fluid to keep your pee (urine) pale yellow.   Managing pain and swelling  Take a warm-water bath (sitz bath) for 20 minutes to ease pain. Do this 3-4 times a day. You may do this in a bathtub or using a portable sitz bath that fits over the toilet.  If told, put ice on the painful area. It may be helpful to use ice between your warm baths. ? Put ice in a plastic bag. ? Place a towel between your skin and the bag. ? Leave the ice on for 20 minutes, 2-3 times a day.   General instructions  Take over-the-counter and prescription medicines only as told by your doctor. ? Medicated creams and medicines may be used as told.  Exercise often. Ask your doctor how much and what kind of exercise is best for you.  Go to the bathroom when you have the urge to poop. Do not wait.  Avoid pushing too hard when you poop.    Keep your butt dry and clean. Use wet toilet paper or moist towelettes after pooping.  Do not sit on the toilet for a long time.  Keep all follow-up visits as told by your doctor. This is important. Contact a doctor if you:  Have pain and swelling that do not get better with treatment or medicine.  Have trouble pooping.  Cannot poop.  Have pain or swelling outside the area of the hemorrhoids. Get help right away if you have:  Bleeding that will not stop. Summary  Hemorrhoids are swollen veins in the butt or around the opening of the  butt.  They can cause pain, itching, or bleeding.  Eat foods that have a lot of fiber in them. These include whole grains, beans, nuts, fruits, and vegetables.  Take a warm-water bath (sitz bath) for 20 minutes to ease pain. Do this 3-4 times a day. This information is not intended to replace advice given to you by your health care provider. Make sure you discuss any questions you have with your health care provider. Document Revised: 04/09/2018 Document Reviewed: 08/21/2017 Elsevier Patient Education  2021 [employer]vier Inc.  

## 2020-05-30 NOTE — ED Triage Notes (Signed)
Emergency Medicine Provider OB Triage Evaluation Note  Denise Fleming is a 25 y.o. female, [id number], at [redacted]w[redacted]d gestation who presents to the emergency department with complaints of hemorrhoids.  Patient ports 3 days ago she noticed some pain around her rectum, she felt a bulge she reports she has had hemorrhoids in the past and this feels very similar.  She reports pain is mild-moderate intensity constant worsened with sitting and straining for bowel movements improved with rest.  Additionally patient reports occasional nausea vomiting related to her pregnancy without change.  Review of  Systems  Positive: Rectal pain Negative: Fever chills abdominal pain diarrhea hematochezia melena dysuria hematuria vaginal bleeding/discharge or any additional concerns  Physical Exam  BP 121/85 (BP Location: Right Arm)   Pulse (!) 107   Temp 99.2 F (37.3 C)   Resp (!) 22   LMP  (LMP Unknown)   SpO2 100%  General: Awake, no distress  HEENT: Atraumatic  Resp: Normal effort  Cardiac: Normal rate Abd: Gravid abdomen nontender MSK: Moves all extremities without difficulty Neuro: Speech clear  Medical Decision Making  Pt evaluated for pregnancy related concern and is stable for transfer to MAU. Pt is in agreement with plan for transfer.  8:05 AM Discussed with MAU APP, Sam, who accepts patient in transfer.  Clinical Impression  No diagnosis found.     Bill Salinas, [place] 05/30/20 [phone]

## 2020-05-30 NOTE — ED Notes (Signed)
Apolinar Junes, PA at triage for [facility].  Report called to Tobi Bastos, MAU.

## 2020-05-30 NOTE — MAU Note (Addendum)
Presents with c/o of hemorrhoid pain, states unable to sit or sleep due to the pain.  States gets hemorrhoids with each pregnancy, but not this bad.

## 2020-05-31 ENCOUNTER — Encounter: Payer: Medicaid Other | Admitting: Obstetrics and Gynecology

## 2020-05-31 ENCOUNTER — Ambulatory Visit (INDEPENDENT_AMBULATORY_CARE_PROVIDER_SITE_OTHER): Payer: Medicaid Other | Admitting: Obstetrics and Gynecology

## 2020-05-31 ENCOUNTER — Encounter: Payer: Self-pay | Admitting: Obstetrics and Gynecology

## 2020-05-31 VITALS — BP 111/73 | HR 102 | Wt 227.0 lb

## 2020-05-31 DIAGNOSIS — K642 Third degree hemorrhoids: Secondary | ICD-10-CM

## 2020-05-31 DIAGNOSIS — Z98891 History of uterine scar from previous surgery: Secondary | ICD-10-CM

## 2020-05-31 DIAGNOSIS — Z3480 Encounter for supervision of other normal pregnancy, unspecified trimester: Secondary | ICD-10-CM

## 2020-05-31 DIAGNOSIS — K649 Unspecified hemorrhoids: Secondary | ICD-10-CM | POA: Insufficient documentation

## 2020-05-31 DIAGNOSIS — Z8759 Personal history of other complications of pregnancy, childbirth and the puerperium: Secondary | ICD-10-CM

## 2020-05-31 MED ORDER — HYDROCORTISONE ACETATE 25 MG RE SUPP: 25.0000 mg | Freq: Two times a day (BID) | RECTAL | 1 refills | Status: DC

## 2020-05-31 MED ORDER — POLYETHYLENE GLYCOL 3350 17 G PO PACK
17.0000 g | PACK | Freq: Every day | ORAL | 0 refills | Status: DC
Start: 2020-05-31 — End: 2020-06-25

## 2020-05-31 NOTE — Progress Notes (Signed)
Subjective:  Denise Fleming is a 25 y.o. [id number] at [redacted]w[redacted]d being seen today for ongoing prenatal care.  She is currently monitored for the following issues for this high-risk pregnancy and has Supervision of other normal pregnancy, antepartum; Late prenatal care affecting pregnancy, antepartum; Red blood cell antibody positive; Obesity in pregnancy, antepartum; Thyroid enlargement; History of C-section; History of shoulder dystocia in prior pregnancy; HSIL (high grade squamous intraepithelial lesion) on Pap smear of cervix; Pelvic pain in pregnancy; Uterine size-date discrepancy in third trimester; Constipation during pregnancy in third trimester; and Hemorrhoids on their problem list.  Patient reports hemorrhoids.  Contractions: Irritability. Vag. Bleeding: None.  Movement: Present. Denies leaking of fluid.   The following portions of the patient's history were reviewed and updated as appropriate: allergies, current medications, past family history, past medical history, past social history, past surgical history and problem list. Problem list updated.  Objective:   Vitals:   05/31/20 1535  BP: 111/73  Pulse: (!) 102  Weight: 227 lb (103 kg)    Fetal Status: Fetal Heart Rate (bpm): 140   Movement: Present     General:  Alert, oriented and cooperative. Patient is in no acute distress.  Skin: Skin is warm and dry. No rash noted.   Cardiovascular: Normal heart rate noted  Respiratory: Normal respiratory effort, no problems with respiration noted  Abdomen: Soft, gravid, appropriate for gestational age. Pain/Pressure: Present     Pelvic:  Cervical exam deferred Grade 3 hemorrhoid, pt declined reduction        Extremities: Normal range of motion.  Edema: None  Mental Status: Normal mood and affect. Normal behavior. Normal judgment and thought content.   Urinalysis:      Assessment and Plan:  Pregnancy: [id number] at [redacted]w[redacted]d  1. Supervision of other normal pregnancy, antepartum Stable GBS  next visit Need to discuss mode of delivery with next OB visit Pt to uncomfortable to discuss today d/t hemorrhoid  2. History of C-section Followed by successful VBAC  3. History of shoulder dystocia in prior pregnancy Growth 90 % on 05/30/20 U/S See above in regards to delivery mode  4. Grade III hemorrhoids  - polyethylene glycol (MIRALAX / GLYCOLAX) 17 g packet; Take 17 g by mouth daily.  Dispense: 14 each; Refill: 0 - hydrocortisone (ANUSOL-HC) 25 MG suppository; Place 1 suppository (25 mg total) rectally 2 (two) times daily.  Dispense: 12 suppository; Refill: 1 - Ambulatory referral to General Surgery  Preterm labor symptoms and general obstetric precautions including but not limited to vaginal bleeding, contractions, leaking of fluid and fetal movement were reviewed in detail with the patient. Please refer to After Visit Summary for other counseling recommendations.  Return in about 1 week (around 06/07/2020) for OB visit, face to face, MD only.   Hermina Staggers, MD

## 2020-05-31 NOTE — Progress Notes (Signed)
ROB   TH:YHOOILN  hemorrhoids  Pt seen yesterday at hospital for pain/hemorrhoids was given Rx. Pt states she was not given pain Rx pt states pain is 7/10x.  Pt still wants provider to look again today.

## 2020-06-01 ENCOUNTER — Telehealth: Payer: Self-pay

## 2020-06-01 NOTE — Telephone Encounter (Signed)
Error

## 2020-06-04 ENCOUNTER — Other Ambulatory Visit: Payer: Self-pay

## 2020-06-04 ENCOUNTER — Inpatient Hospital Stay (HOSPITAL_COMMUNITY)
Admission: AD | Admit: 2020-06-04 | Discharge: 2020-06-04 | Disposition: A | Discharge status: Home / Self Care | Payer: Medicaid Other | Attending: Obstetrics & Gynecology | Admitting: Obstetrics & Gynecology

## 2020-06-04 ENCOUNTER — Encounter (HOSPITAL_COMMUNITY): Payer: Self-pay | Admitting: Obstetrics & Gynecology

## 2020-06-04 ENCOUNTER — Inpatient Hospital Stay (EMERGENCY_DEPARTMENT_HOSPITAL)
Admission: AD | Admit: 2020-06-04 | Discharge: 2020-06-04 | Disposition: A | Discharge status: Home / Self Care | Payer: Medicaid Other | Source: Home / Self Care | Attending: Obstetrics & Gynecology | Admitting: Obstetrics & Gynecology

## 2020-06-04 DIAGNOSIS — O0930 Supervision of pregnancy with insufficient antenatal care, unspecified trimester: Secondary | ICD-10-CM

## 2020-06-04 DIAGNOSIS — O99613 Diseases of the digestive system complicating pregnancy, third trimester: Secondary | ICD-10-CM | POA: Diagnosis not present

## 2020-06-04 DIAGNOSIS — Z3A36 36 weeks gestation of pregnancy: Secondary | ICD-10-CM | POA: Insufficient documentation

## 2020-06-04 DIAGNOSIS — K645 Perianal venous thrombosis: Secondary | ICD-10-CM

## 2020-06-04 DIAGNOSIS — O2243 Hemorrhoids in pregnancy, third trimester: Secondary | ICD-10-CM | POA: Insufficient documentation

## 2020-06-04 DIAGNOSIS — Z3480 Encounter for supervision of other normal pregnancy, unspecified trimester: Secondary | ICD-10-CM

## 2020-06-04 DIAGNOSIS — O0933 Supervision of pregnancy with insufficient antenatal care, third trimester: Secondary | ICD-10-CM | POA: Insufficient documentation

## 2020-06-04 DIAGNOSIS — R87613 High grade squamous intraepithelial lesion on cytologic smear of cervix (HGSIL): Secondary | ICD-10-CM

## 2020-06-04 DIAGNOSIS — Z8759 Personal history of other complications of pregnancy, childbirth and the puerperium: Secondary | ICD-10-CM

## 2020-06-04 MED ORDER — BUTALBITAL-APAP-CAFFEINE 50-325-40 MG PO TABS
2.0000 | ORAL_TABLET | Freq: Once | ORAL | Status: AC
  Administered 2020-06-04: 2 via ORAL
  Filled 2020-06-04: qty 2

## 2020-06-04 MED ORDER — LIDOCAINE 5 % EX OINT
TOPICAL_OINTMENT | Freq: Every day | CUTANEOUS | Status: DC | PRN
  Filled 2020-06-04: qty 35.44

## 2020-06-04 MED ORDER — NITROGLYCERIN 2 % TD OINT
0.5000 [in_us] | TOPICAL_OINTMENT | Freq: Once | TRANSDERMAL | Status: AC
  Administered 2020-06-04: 0.5 [in_us] via TOPICAL
  Filled 2020-06-04: qty 30

## 2020-06-04 MED ORDER — LIDOCAINE HCL URETHRAL/MUCOSAL 2 % EX GEL
1.0000 "application " | Freq: Once | CUTANEOUS | Status: AC
  Administered 2020-06-04: 1 via TOPICAL
  Filled 2020-06-04: qty 5

## 2020-06-04 MED ORDER — LIDOCAINE-EPINEPHRINE (PF) 2 %-1:200000 IJ SOLN
20.0000 mL | Freq: Once | INTRAMUSCULAR | Status: AC
  Administered 2020-06-04: 20 mL via INTRADERMAL
  Filled 2020-06-04 (×2): qty 20

## 2020-06-04 NOTE — MAU Note (Signed)
Consent obtained for procedure-pt declined interpreter

## 2020-06-04 NOTE — MAU Provider Note (Addendum)
History     CSN: [id number]  Arrival date and time: 06/04/20 6659   Event Date/Time   First Provider Initiated Contact with Patient 06/04/20 [phone]      Chief Complaint  Patient presents with  . Contractions   Denise Fleming is a 25 y.o. G5P4 at [redacted]w[redacted]d who presents to MAU with complaints of contractions and hemorrhoid pain. Patient reports that every time she has a contraction it makes the hemorrhoids pain worse. Patient reports that she was here last week for same pain and was sent home with Anusol, which is not helping. Patient reports that pain has continued to get worse since last visit until today where she could not take the pain any more. Patient is writhing in bed and unable to sit down due to pain. Patient reports that bleeding started occurring from the hemorrhoids today. Denies vaginal bleeding, discharge. +FM.    OB History    Gravida  5   Para  4   Term  3   Preterm  1   AB  0   Living  4     SAB      IAB      Ectopic      Multiple      Live Births  4        Obstetric Comments  1st baby "premature"-[place], [place] 3rd baby C/S-[place]         Past Medical History:  Diagnosis Date  . Medical history non-contributory     Past Surgical History:  Procedure Laterality Date  . CESAREAN SECTION      Family History  Problem Relation Age of Onset  . Healthy Mother     Social History   Tobacco Use  . Smoking status: Never Smoker  . Smokeless tobacco: Never Used  Vaping Use  . Vaping Use: Never used  Substance Use Topics  . Alcohol use: Never  . Drug use: Never    Allergies: No Known Allergies  Medications Prior to Admission  Medication Sig Dispense Refill Last Dose  . cyclobenzaprine (FLEXERIL) 10 MG tablet Take 1 tablet (10 mg total) by mouth 2 (two) times daily as needed for muscle spasms. 20 tablet 0 Past Week at Unknown time  . Acetaminophen (TYLENOL PO) Take 1 tablet by mouth every 4 (four) hours as needed  (pain/headache). Pt is unsure if tylenol is 325 or 500     . aspirin EC 81 MG tablet Take 1 tablet (81 mg total) by mouth daily. 60 tablet 2   . docusate sodium (COLACE) 100 MG capsule Take 1 capsule (100 mg total) by mouth 2 (two) times daily. 60 capsule 1   . hydrocortisone (ANUSOL-HC) 2.5 % rectal cream Place rectally 2 (two) times daily for 5 days. 30 g 0   . hydrocortisone (ANUSOL-HC) 25 MG suppository Place 1 suppository (25 mg total) rectally 2 (two) times daily. 12 suppository 1   . ondansetron (ZOFRAN) 4 MG tablet Take 1 tablet (4 mg total) by mouth every 6 (six) hours. (Patient not taking: No sig reported) 12 tablet 0   . pantoprazole (PROTONIX) 20 MG tablet Take 1 tablet (20 mg total) by mouth daily. (Patient not taking: No sig reported) 30 tablet 1   . polyethylene glycol (MIRALAX / GLYCOLAX) 17 g packet Take 17 g by mouth daily. 14 each 0   . Prenatal 27-1 MG TABS Take 1 tablet by mouth daily. (Patient not taking: No sig reported) 30 tablet 12  Review of Systems  Constitutional: Negative.   Respiratory: Negative.   Cardiovascular: Negative.   Gastrointestinal: Positive for abdominal pain and rectal pain.       Contractions and hemorrhoids  Genitourinary: Negative.   Musculoskeletal: Negative.   Neurological: Negative.   Psychiatric/Behavioral: Negative.    Physical Exam   Blood pressure 119/68, pulse 98, temperature 98.8 F (37.1 C), temperature source Oral, resp. rate 20, height 5\' 2"  (1.575 m), weight 101.6 kg, SpO2 99 %.  Physical Exam Vitals and nursing note reviewed.  Constitutional:      General: She is in acute distress.  Cardiovascular:     Rate and Rhythm: Normal rate and regular rhythm.  Pulmonary:     Effort: Pulmonary effort is normal. No respiratory distress.     Breath sounds: Normal breath sounds. No wheezing.  Abdominal:     Palpations: Abdomen is soft. There is no mass.     Tenderness: There is no abdominal tenderness. There is no guarding.      Comments: Gravid  Genitourinary:    Rectum: Tenderness and external hemorrhoid present.     Comments: 2 external hemorrhoids: one 3cm thrombosed and bleeding hemorrhoid (grade 4) and one 1.5cm hemorrhoid that is reducible Musculoskeletal:     Right lower leg: No edema.     Left lower leg: No edema.  Neurological:     Mental Status: She is alert and oriented to person, place, and time.  Psychiatric:        Mood and Affect: Mood normal.        Behavior: Behavior normal.        Thought Content: Thought content normal.    Fetal monitoring:  140/moderate/+accels/ no decel  Occasional mild contractions   Dilation: 1.5 Effacement (%): 50 Cervical Position: Posterior Station: -2 Presentation: Vertex Exam by:: weston,rn   MAU Course  Procedures  MDM Meds ordered this encounter  Medications  . lidocaine (XYLOCAINE) 2 % jelly 1 application  . nitroGLYCERIN (NITROGLYN) 2 % ointment 0.5 inch      Hemorrhoid unable to be reduced - lidocaine jelly and nitroglycerin ointment applied  No cervical change on recheck   Consult with Dr [phone] who recommends sending patient home with nitroglycerin ointment for continued use.   Reassessment and still unable to be reduced - discussed plan of care with patient and management. Discussed continued use of nitroglycerin ointment BIDx 4 days (use gloves), lidocaine jelly after using the restroom, sitz bath nightly and ice topically BID. Patient verbalizes understanding.   Discussed reasons to return to MAU. Follow up as scheduled in the office. Return to MAU as needed. Pt stable at time of discharge.   Assessment and Plan   1. Thrombosed external hemorrhoids   2. History of shoulder dystocia in prior pregnancy   3. HSIL (high grade squamous intraepithelial lesion) on Pap smear of cervix   4. Supervision of other normal pregnancy, antepartum   5. Late prenatal care affecting pregnancy, antepartum    Discharge home Follow up as scheduled in  the office for prenatal care Return to MAU as needed for reasons discussed and/or emergencies  Nitroglycerin ointment and lidocaine jelly sent home with patient with detailed instructions    Follow-up Information    [facility] Follow up.   Specialty: Obstetrics and Gynecology Contact information: [address], [address] [place] [phone]             Allergies as of 06/04/2020  No Known Allergies     Medication List    STOP taking these medications   hydrocortisone 2.5 % rectal cream Commonly known as: ANUSOL-HC   hydrocortisone 25 MG suppository Commonly known as: ANUSOL-HC     TAKE these medications   aspirin EC 81 MG tablet Take 1 tablet (81 mg total) by mouth daily.   cyclobenzaprine 10 MG tablet Commonly known as: FLEXERIL Take 1 tablet (10 mg total) by mouth 2 (two) times daily as needed for muscle spasms.   docusate sodium 100 MG capsule Commonly known as: Colace Take 1 capsule (100 mg total) by mouth 2 (two) times daily.   ondansetron 4 MG tablet Commonly known as: ZOFRAN Take 1 tablet (4 mg total) by mouth every 6 (six) hours.   pantoprazole 20 MG tablet Commonly known as: Protonix Take 1 tablet (20 mg total) by mouth daily.   polyethylene glycol 17 g packet Commonly known as: MIRALAX / GLYCOLAX Take 17 g by mouth daily.   Prenatal 27-1 MG Tabs Take 1 tablet by mouth daily.   TYLENOL PO Take 1 tablet by mouth every 4 (four) hours as needed (pain/headache). Pt is unsure if tylenol is 325 or 500       Sharyon Cable CNM 06/04/2020, 3:15 AM

## 2020-06-04 NOTE — Discharge Instructions (Signed)
Use nitroglycerin ointment (0.5inch) every 12 hours for 4 days. - wear gloves when applying ointment and wash hand immediately after  Continue sitz bath nightly  Place ice wrapped in cloth every 12 hours

## 2020-06-04 NOTE — MAU Note (Signed)
5% Lidocaine gel applied to rectal area/hemorrhoid per Dr.Eure. Procedure explained to patient-pt agreeable.

## 2020-06-04 NOTE — MAU Note (Signed)
Pt here last night for hemorrhoids and has tried all meds and treatments prescribed last pm. Pain is worsening. Also reports a headache all day today.

## 2020-06-04 NOTE — MAU Note (Signed)
At bedside to assist MD with visualization during procedure. Pt tolerated well and states the pressure and pain in her rectal area is resolved at this time. Dr.Eure discussed after care and follow up-pt reports understanding. Ice pack applied and site checked for bleeding. No active bleeding noted guaze applied per MD at end of procedure remains in place

## 2020-06-04 NOTE — MAU Note (Signed)
Pt reports contractions x 6 hours, now seems more constant.

## 2020-06-04 NOTE — Op Note (Signed)
Pre op: thrombosed hemorrhoid  Post op: same  Procedure: Incision and evacuation of thrombosed hemorrhoid, with unroofing  Surgeon: Lazaro Arms, MD  Procedure: Pt with large thrombosed hemorrhoid recurrent visits unresponsive to nitroglycerin ointment Presented less than 24 hours after last MAU visit  5% lidocaine ointment was applied for 20 minutes 6 cc of 2% lidocaine with epinephrine was then injected along the skin covering the thrombosed hemorrhoid An elliptical incision was made and the hemorrhoid had 7 separate distinct clots with mulitple loculations of the clot All 7 were evacuated and the hemorrhoid was completely deflated    There was minor bleeding after evacuation, which will be expected to continue to some degree  Post op local care was discussed, Sitz bath local pressure miralax  Keep scheduled follow up  Lazaro Arms, MD 06/04/2020 10:25 PM

## 2020-06-04 NOTE — MAU Note (Signed)
Pt reports headache is decreasing-po medications effective

## 2020-06-07 ENCOUNTER — Other Ambulatory Visit (HOSPITAL_COMMUNITY)
Admission: RE | Admit: 2020-06-07 | Discharge: 2020-06-07 | Disposition: A | Discharge status: Home / Self Care | Payer: Medicaid Other | Source: Ambulatory Visit | Attending: Obstetrics & Gynecology | Admitting: Obstetrics & Gynecology

## 2020-06-07 ENCOUNTER — Encounter: Payer: Medicaid Other | Admitting: Student

## 2020-06-07 ENCOUNTER — Other Ambulatory Visit: Payer: Self-pay

## 2020-06-07 ENCOUNTER — Ambulatory Visit (INDEPENDENT_AMBULATORY_CARE_PROVIDER_SITE_OTHER): Payer: Medicaid Other | Admitting: Obstetrics & Gynecology

## 2020-06-07 ENCOUNTER — Encounter: Payer: Self-pay | Admitting: Obstetrics & Gynecology

## 2020-06-07 VITALS — BP 100/62 | HR 83 | Wt 224.8 lb

## 2020-06-07 DIAGNOSIS — Z98891 History of uterine scar from previous surgery: Secondary | ICD-10-CM

## 2020-06-07 DIAGNOSIS — Z8759 Personal history of other complications of pregnancy, childbirth and the puerperium: Secondary | ICD-10-CM

## 2020-06-07 DIAGNOSIS — O99820 Streptococcus B carrier state complicating pregnancy: Secondary | ICD-10-CM

## 2020-06-07 DIAGNOSIS — Z3480 Encounter for supervision of other normal pregnancy, unspecified trimester: Secondary | ICD-10-CM | POA: Insufficient documentation

## 2020-06-07 NOTE — Progress Notes (Signed)
Pt has no questions or concerns at this time

## 2020-06-07 NOTE — Progress Notes (Signed)
   PRENATAL VISIT NOTE  Subjective:  Denise Fleming is a 25 y.o. [id number] at [redacted]w[redacted]d being seen today for ongoing prenatal care.  She is currently monitored for the following issues for this high-risk pregnancy and has Supervision of other normal pregnancy, antepartum; Late prenatal care affecting pregnancy, antepartum; Red blood cell antibody positive; Obesity in pregnancy, antepartum; Thyroid enlargement; History of C-section; History of shoulder dystocia in prior pregnancy; HSIL (high grade squamous intraepithelial lesion) on Pap smear of cervix; Pelvic pain in pregnancy; Uterine size-date discrepancy in third trimester; Constipation during pregnancy in third trimester; and Hemorrhoids on their problem list.  Patient reports occasional contractions.  Contractions: Irritability. Vag. Bleeding: None.  Movement: Present. Denies leaking of fluid.   The following portions of the patient's history were reviewed and updated as appropriate: allergies, current medications, past family history, past medical history, past social history, past surgical history and problem list.   Objective:   Vitals:   06/07/20 1442  BP: 100/62  Pulse: 83  Weight: 224 lb 12.8 oz (102 kg)    Fetal Status: Fetal Heart Rate (bpm): 140   Movement: Present     General:  Alert, oriented and cooperative. Patient is in no acute distress.  Skin: Skin is warm and dry. No rash noted.   Cardiovascular: Normal heart rate noted  Respiratory: Normal respiratory effort, no problems with respiration noted  Abdomen: Soft, gravid, appropriate for gestational age.  Pain/Pressure: Present     Pelvic: Cervical exam performed in the presence of a chaperone        Extremities: Normal range of motion.     Mental Status: Normal mood and affect. Normal behavior. Normal judgment and thought content.   Assessment and Plan:  Pregnancy: E0P2330 at [redacted]w[redacted]d 1. Supervision of other normal pregnancy, antepartum Routine testing - Strep Gp B  NAA - GC/Chlamydia probe amp ([place])not at [facility]  2. 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 RCS,   3. History of shoulder dystocia in prior pregnancy As above  Term labor symptoms and general obstetric precautions including but not limited to vaginal bleeding, contractions, leaking of fluid and fetal movement were reviewed in detail with the patient. Please refer to After Visit Summary for other counseling recommendations.   Return in about 1 week (around 06/14/2020).  Future Appointments  Date Time Provider Department Center  06/14/2020  1:30 PM Hermina Staggers, MD [facility] None    Scheryl Darter, MD

## 2020-06-07 NOTE — Patient Instructions (Signed)
Shoulder Dystocia  Shoulder dystocia occurs during labor, when a newborn's shoulders get stuck on the mother's pubic bone after the head is delivered. This condition is a medical emergency. It can cause nerve damage in the baby's shoulders, arms, hands, or fingers (brachial plexus injury), a fracture in the collarbone or upper arm, or brain damage due to lack of oxygen during prolonged labor. In the mother, this condition can cause severe tearing (laceration) of the vagina, rectum, or cervix. Damage to nerves that control the mother's ability to hold and pass stool (fecal incontinence) also can occur. Most of the time, problems that are caused by shoulder dystocia are not permanent. What are the causes? Shoulder dystocia occurs during a vaginal delivery. This happens when a newborn's shoulders get stuck on the mother's pubic bone after the head is delivered. What increases the risk? The following factors make it more likely for this condition to occur:  The baby weighs more than 8.8 lb (4 kg).  The mother has diabetes.  The mother has a history of shoulder dystocia during delivery.  The mother is obese.  The mother's labor was induced.  Labor that is faster than usual.  Labor that is slow and prolonged. What are the signs or symptoms? Signs of this condition may include:  The baby moves very slowly down the birth canal.  The baby's head moves back into the opening of the vagina during contractions. This is called the turtle sign.  The baby's shoulders are vertical and appear to come out at the same time during delivery. In a normal delivery, the baby's shoulders are usually diagonal to the mother's pelvis and come out one at a time. How is this diagnosed? This condition may be diagnosed by your health care provider during labor by a physical exam. Your health care provider may suspect shoulder dystocia if your labor does not progress after the baby's head has been delivered. How is  this treated? A health care provider can try several techniques to deliver the baby safely. Some of these include:  Repositioning the mother. This may involve the mother flexing her thighs up toward her abdomen (McRoberts maneuver).  Pressing against the mother's abdomen (suprapubic pressure).  Making an incision to widen the opening of the vagina (episiotomy).  Reaching internally to press on the baby's shoulder or rotating the baby (Wood's screw maneuver).  Pushing the baby back into the uterus and delivering the baby by cesarean delivery. This is only done if other methods have not worked. Summary  Shoulder dystocia occurs during labor, when a baby's shoulders get stuck on the mother's pubic bone after the head is delivered.  Shoulder dystocia is a medical emergency.  If shoulder dystocia happens during labor, the mother may be repositioned, an episiotomy may be done, the baby may be rotated, or a cesarean delivery may be done.  Most of the time, the problems that are caused by shoulder dystocia are not permanent. This information is not intended to replace advice given to you by your health care provider. Make sure you discuss any questions you have with your health care provider. Document Revised: 12/27/2019 Document Reviewed: 08/09/2019 Elsevier Patient Education  2021 [employer] Vaginal Birth After Cesarean Delivery  Vaginal birth after cesarean delivery (VBAC) is giving birth vaginally after previously delivering a baby through a cesarean section (C-section). A VBAC may be a safe option for you, depending on your health and other factors. It is important to discuss VBAC with your health care  provider early in your pregnancy so you can understand the risks, benefits, and options. Having these discussions early will give you time to make your birth plan. Who are the best candidates for VBAC? The best candidates for VBAC are women who:  Have had one or two prior cesarean  deliveries, and the incision made during the delivery was horizontal (low transverse).  Do not have a vertical (classical) scar on their uterus.  Have not had a tear in the wall of their uterus (uterine rupture).  Plan to have more pregnancies. A VBAC is also more likely to be successful:  In women who have previously given birth vaginally.  When labor starts by itself (spontaneously) before the due date. What are the benefits of VBAC? The benefits of delivering your baby vaginally instead of by a cesarean delivery include:  A shorter hospital stay.  A faster recovery time.  Less pain.  Avoiding risks associated with major surgery, such as infection and blood clots.  Less blood loss and less need for donated blood (transfusions). What are the risks of VBAC? The main risk of attempting a VBAC is that it may fail, forcing your health care provider to deliver your baby by a C-section. Other risks are rare and include:  Tearing (rupture) of the scar from a past cesarean delivery.  Other risks associated with vaginal deliveries. If a repeat cesarean delivery is needed, the risks include:  Blood loss.  Infection.  Blood clot.  Damage to surrounding organs.  Removal of the uterus (hysterectomy), if it is damaged.  Placenta problems in future pregnancies. What else should I know about my options? Delivering a baby through a VBAC is similar to having a normal spontaneous vaginal delivery. Therefore, it is safe:  To try with twins.  For your health care provider to try to turn the baby from a breech position (external cephalic version) during labor.  With epidural analgesia for pain relief. Consider where you would like to deliver your baby. VBAC should be attempted in facilities where an emergency cesarean delivery can be performed. VBAC is not recommended for home births. Any changes in your health or your baby's health during your pregnancy may make it necessary to  change your initial decision about VBAC. Your health care provider may recommend that you do not attempt a VBAC if:  Your baby's suspected weight is 8.8 lb (4 kg) or more.  You have preeclampsia. This is a condition that causes high blood pressure along with other symptoms, such as swelling and headaches.  You will have VBAC less than 19 months after your cesarean delivery.  You are past your due date.  You need to have labor started (induced) because your cervix is not ready for labor (unfavorable). Where to find more information  American Pregnancy Association: americanpregnancy.org  [employer]: acog.org Summary  Vaginal birth after cesarean delivery (VBAC) is giving birth vaginally after previously delivering a baby through a cesarean section (C-section). A VBAC may be a safe option for you, depending on your health and other factors.  Discuss VBAC with your health care provider early in your pregnancy so you can understand the risks, benefits, options, and have plenty of time to make your birth plan.  The main risk of attempting a VBAC is that it may fail, forcing your health care provider to deliver your baby by a C-section. Other risks are rare. This information is not intended to replace advice given to you by your  health care provider. Make sure you discuss any questions you have with your health care provider. Document Revised: 07/28/2018 Document Reviewed: 07/09/2016 Elsevier Patient Education  2021 [employer].

## 2020-06-08 LAB — GC/CHLAMYDIA PROBE AMP (~~LOC~~) NOT AT ARMC
Chlamydia: NEGATIVE
Comment: NEGATIVE
Comment: NORMAL
Neisseria Gonorrhea: NEGATIVE

## 2020-06-09 LAB — STREP GP B NAA: Strep Gp B NAA: POSITIVE — AB

## 2020-06-12 DIAGNOSIS — O99820 Streptococcus B carrier state complicating pregnancy: Secondary | ICD-10-CM | POA: Insufficient documentation

## 2020-06-14 ENCOUNTER — Encounter: Payer: Self-pay | Admitting: Obstetrics

## 2020-06-14 ENCOUNTER — Encounter: Payer: Self-pay | Admitting: Obstetrics and Gynecology

## 2020-06-14 ENCOUNTER — Other Ambulatory Visit: Payer: Self-pay

## 2020-06-14 ENCOUNTER — Ambulatory Visit (INDEPENDENT_AMBULATORY_CARE_PROVIDER_SITE_OTHER): Payer: Medicaid Other | Admitting: Obstetrics and Gynecology

## 2020-06-14 VITALS — BP 111/80 | HR 103 | Temp 98.7°F | Wt 222.4 lb

## 2020-06-14 DIAGNOSIS — Z3480 Encounter for supervision of other normal pregnancy, unspecified trimester: Secondary | ICD-10-CM

## 2020-06-14 DIAGNOSIS — O368131 Decreased fetal movements, third trimester, fetus 1: Secondary | ICD-10-CM

## 2020-06-14 DIAGNOSIS — Z3A37 37 weeks gestation of pregnancy: Secondary | ICD-10-CM | POA: Diagnosis not present

## 2020-06-14 DIAGNOSIS — O99820 Streptococcus B carrier state complicating pregnancy: Secondary | ICD-10-CM

## 2020-06-14 DIAGNOSIS — K642 Third degree hemorrhoids: Secondary | ICD-10-CM

## 2020-06-14 DIAGNOSIS — Z98891 History of uterine scar from previous surgery: Secondary | ICD-10-CM

## 2020-06-14 NOTE — Progress Notes (Signed)
Subjective:  Denise Fleming is a 25 y.o. [id number] at [redacted]w[redacted]d being seen today for ongoing prenatal care.  She is currently monitored for the following issues for this high-risk pregnancy and has Supervision of other normal pregnancy, antepartum; Late prenatal care affecting pregnancy, antepartum; Red blood cell antibody positive; Obesity in pregnancy, antepartum; Thyroid enlargement; History of C-section; History of shoulder dystocia in prior pregnancy; HSIL (high grade squamous intraepithelial lesion) on Pap smear of cervix; Pelvic pain in pregnancy; Uterine size-date discrepancy in third trimester; Constipation during pregnancy in third trimester; Hemorrhoids; and GBS (group B Streptococcus carrier), +RV culture, currently pregnant on their problem list.  Patient reports general discomforts of pregnancy.  Contractions: Irritability. Vag. Bleeding: None.  Movement: (!) Decreased. Denies leaking of fluid.   The following portions of the patient's history were reviewed and updated as appropriate: allergies, current medications, past family history, past medical history, past social history, past surgical history and problem list. Problem list updated.  Objective:   Vitals:   06/14/20 1348  BP: 111/80  Pulse: (!) 103  Temp: 98.7 F (37.1 C)  Weight: 222 lb 6.4 oz (100.9 kg)    Fetal Status:     Movement: (!) Decreased     General:  Alert, oriented and cooperative. Patient is in no acute distress.  Skin: Skin is warm and dry. No rash noted.   Cardiovascular: Normal heart rate noted  Respiratory: Normal respiratory effort, no problems with respiration noted  Abdomen: Soft, gravid, appropriate for gestational age. Pain/Pressure: Present     Pelvic:  Cervical exam performed        Extremities: Normal range of motion.  Edema: None  Mental Status: Normal mood and affect. Normal behavior. Normal judgment and thought content.   Urinalysis:      Assessment and Plan:  Pregnancy: [id number] at  102w6d  1. Decreased fetal movements in third trimester, fetus 1 of multiple gestation Reactive NST today Baseline 130-140's, + acels, no decels, one ut ctx - Fetal nonstress test  2. Supervision of other normal pregnancy, antepartum Stable  3. History of C-section Desires repeat c section now Will schedule at 39 weeks  4. Grade III hemorrhoids S/P lacent by general surgery, improved  5. GBS (group B Streptococcus carrier), +RV culture, currently pregnant Tx while in labor  Term labor symptoms and general obstetric precautions including but not limited to vaginal bleeding, contractions, leaking of fluid and fetal movement were reviewed in detail with the patient. Please refer to After Visit Summary for other counseling recommendations.  No follow-ups on file.   Hermina Staggers, MD

## 2020-06-14 NOTE — Progress Notes (Signed)
Patient presents for ROB. Patient states that she had some bleeding that started this weekend. Bleeding has stopped. She also complains of having decreased fetal movement today. No other concerns.

## 2020-06-15 ENCOUNTER — Encounter: Payer: Self-pay | Admitting: *Deleted

## 2020-06-19 NOTE — H&P (Signed)
Denise Fleming is an 25 y.o. [phone] [redacted]w[redacted]d female.   Chief Complaint: Prior Csection and h/o SD with Erb's palsy HPI: for ERLTCS  Past Medical History:  Diagnosis Date  . Medical history non-contributory     Past Surgical History:  Procedure Laterality Date  . CESAREAN SECTION      Family History  Problem Relation Age of Onset  . Healthy Mother    Social History:  reports that she has never smoked. She has never used smokeless tobacco. She reports that she does not drink alcohol and does not use drugs.   No Known Allergies  No medications prior to admission.     A comprehensive review of systems was negative.  There were no vitals taken for this visit. General appearance: alert, cooperative and appears stated age Head: Normocephalic, without obvious abnormality, atraumatic Neck: supple, symmetrical, trachea midline Lungs: normal effort Heart: regular rate and rhythm Abdomen: gravid, non-tender Extremities: Homans sign is negative, no sign of DVT Skin: Skin color, texture, turgor normal. No rashes or lesions Neurologic: Grossly normal   Lab Results  Component Value Date   WBC 10.2 04/28/2020   HGB 12.0 04/28/2020   HCT 35.2 04/28/2020   MCV 95 04/28/2020   PLT 178 04/28/2020         ABO, Rh: --/--/O POS (12/15 1509)  Antibody: NEG Performed at [facility], [address] [place], [place] [address]  [phone]/08 1101)  Rubella: 6.09 (11/15 1038)  RPR: Non Reactive (01/14 0853)  HBsAg: Negative (11/15 1038)  HIV: Non Reactive (01/14 0853)  GBS: Positive/-- (02/23 0308)     Assessment/Plan Active Problems:   History of C-section   History of Shoulder dystocia   LGA  For RLTCS. Risks include but are not limited to bleeding, infection, injury to surrounding structures, including bowel, bladder and ureters, blood clots, and death.  Likelihood of success is high.   Reva Bores 06/19/2020, 1:32 PM

## 2020-06-20 ENCOUNTER — Encounter (HOSPITAL_COMMUNITY): Payer: Self-pay

## 2020-06-20 NOTE — Patient Instructions (Signed)
Denise Fleming  06/20/2020   Your procedure is scheduled on:  3.11.2022  Arrive at 1030 at Entrance C on [address] at [facility]  and [employer]. You are invited to use the FREE valet parking or use the Visitor's parking deck.  Pick up the phone at the desk and dial [phone].  Call this number if you have problems the morning of surgery: [phone]  Remember:   Do not eat food:(After Midnight) Desps de medianoche.  Do not drink clear liquids: (After Midnight) Desps de medianoche.  Take these medicines the morning of surgery with A SIP OF WATER:  none   Do not wear jewelry, make-up or nail polish.  Do not wear lotions, powders, or perfumes. Do not wear deodorant.  Do not shave 48 hours prior to surgery.  Do not bring valuables to the hospital.  [facility] is not   responsible for any belongings or valuables brought to the hospital.  Contacts, dentures or bridgework may not be worn into surgery.  Leave suitcase in the car. After surgery it may be brought to your room.  For patients admitted to the hospital, checkout time is 11:00 AM the day of              discharge.      Please read over the following fact sheets that you were given:     Preparing for Surgery

## 2020-06-21 ENCOUNTER — Other Ambulatory Visit (HOSPITAL_COMMUNITY): Payer: Medicaid Other

## 2020-06-21 ENCOUNTER — Encounter: Payer: Self-pay | Admitting: Obstetrics and Gynecology

## 2020-06-21 ENCOUNTER — Ambulatory Visit (INDEPENDENT_AMBULATORY_CARE_PROVIDER_SITE_OTHER): Payer: Medicaid Other | Admitting: Obstetrics and Gynecology

## 2020-06-21 ENCOUNTER — Encounter (HOSPITAL_COMMUNITY)
Admission: RE | Admit: 2020-06-21 | Discharge: 2020-06-21 | Disposition: A | Discharge status: Home / Self Care | Payer: Medicaid Other | Source: Ambulatory Visit | Attending: Family Medicine | Admitting: Family Medicine

## 2020-06-21 ENCOUNTER — Other Ambulatory Visit: Payer: Self-pay

## 2020-06-21 VITALS — BP 112/69 | HR 86 | Wt 224.6 lb

## 2020-06-21 DIAGNOSIS — O0930 Supervision of pregnancy with insufficient antenatal care, unspecified trimester: Secondary | ICD-10-CM

## 2020-06-21 DIAGNOSIS — Z3480 Encounter for supervision of other normal pregnancy, unspecified trimester: Secondary | ICD-10-CM

## 2020-06-21 DIAGNOSIS — K5900 Constipation, unspecified: Secondary | ICD-10-CM

## 2020-06-21 DIAGNOSIS — Z01812 Encounter for preprocedural laboratory examination: Secondary | ICD-10-CM | POA: Insufficient documentation

## 2020-06-21 DIAGNOSIS — R87613 High grade squamous intraepithelial lesion on cytologic smear of cervix (HGSIL): Secondary | ICD-10-CM

## 2020-06-21 DIAGNOSIS — R768 Other specified abnormal immunological findings in serum: Secondary | ICD-10-CM

## 2020-06-21 DIAGNOSIS — O99210 Obesity complicating pregnancy, unspecified trimester: Secondary | ICD-10-CM

## 2020-06-21 DIAGNOSIS — O99613 Diseases of the digestive system complicating pregnancy, third trimester: Secondary | ICD-10-CM

## 2020-06-21 DIAGNOSIS — Z98891 History of uterine scar from previous surgery: Secondary | ICD-10-CM

## 2020-06-21 DIAGNOSIS — Z3A38 38 weeks gestation of pregnancy: Secondary | ICD-10-CM

## 2020-06-21 DIAGNOSIS — Z8759 Personal history of other complications of pregnancy, childbirth and the puerperium: Secondary | ICD-10-CM

## 2020-06-21 DIAGNOSIS — O99820 Streptococcus B carrier state complicating pregnancy: Secondary | ICD-10-CM

## 2020-06-21 DIAGNOSIS — R7689 Other specified abnormal immunological findings in serum: Secondary | ICD-10-CM

## 2020-06-21 LAB — CBC
HCT: 32.7 % — ABNORMAL LOW (ref 36.0–46.0)
Hemoglobin: 11.3 g/dL — ABNORMAL LOW (ref 12.0–15.0)
MCH: 32.8 pg (ref 26.0–34.0)
MCHC: 34.6 g/dL (ref 30.0–36.0)
MCV: 95.1 fL (ref 80.0–100.0)
Platelets: 203 10*3/uL (ref 150–400)
RBC: 3.44 MIL/uL — ABNORMAL LOW (ref 3.87–5.11)
RDW: 13.8 % (ref 11.5–15.5)
WBC: 8.9 10*3/uL (ref 4.0–10.5)
nRBC: 0 % (ref 0.0–0.2)

## 2020-06-21 LAB — TYPE AND SCREEN
ABO/RH(D): O POS
Antibody Screen: NEGATIVE

## 2020-06-21 NOTE — Patient Instructions (Signed)
-Plan for Cesarean delivery on 06/23/20 as previously scheduled. Please call sooner if other questions or concerns.  [email or web address]  Third Trimester of Pregnancy  The third trimester of pregnancy is from week 28 through week 40. This is months 7 through 9. The third trimester is a time when the unborn baby (fetus) is growing rapidly. At the end of the ninth month, the fetus is about 20 inches long and weighs 6-10 pounds. Body changes during your third trimester During the third trimester, your body will continue to go through many changes. The changes vary and generally return to normal after your baby is born. Physical changes  Your weight will continue to increase. You can expect to gain 25-35 pounds (11-16 kg) by the end of the pregnancy if you begin pregnancy at a normal weight. If you are underweight, you can expect to gain 28-40 lb (about 13-18 kg), and if you are overweight, you can expect to gain 15-25 lb (about 7-11 kg).  You may begin to get stretch marks on your hips, abdomen, and breasts.  Your breasts will continue to grow and may hurt. A yellow fluid (colostrum) may leak from your breasts. This is the first milk you are producing for your baby.  You may have changes in your hair. These can include thickening of your hair, rapid growth, and changes in texture. Some people also have hair loss during or after pregnancy, or hair that feels dry or thin.  Your belly button may stick out.  You may notice more swelling in your hands, face, or ankles. Health changes  You may have heartburn.  You may have constipation.  You may develop hemorrhoids.  You may develop swollen, bulging veins (varicose veins) in your legs.  You may have increased body aches in the pelvis, back, or thighs. This is due to weight gain and increased hormones that are relaxing your joints.  You may have increased tingling or numbness in your  hands, arms, and legs. The skin on your abdomen may also feel numb.  You may feel short of breath because of your expanding uterus. Other changes  You may urinate more often because the fetus is moving lower into your pelvis and pressing on your bladder.  You may have more problems sleeping. This may be caused by the size of your abdomen, an increased need to urinate, and an increase in your body's metabolism.  You may notice the fetus "dropping," or moving lower in your abdomen (lightening).  You may have increased vaginal discharge.  You may notice that you have pain around your pelvic bone as your uterus distends. Follow these instructions at home: Medicines  Follow your health care provider's instructions regarding medicine use. Specific medicines may be either safe or unsafe to take during pregnancy. Do not take any medicines unless approved by your health care provider.  Take a prenatal vitamin that contains at least 600 micrograms (mcg) of folic acid. Eating and drinking  Eat a healthy diet that includes fresh fruits and vegetables, whole grains, good sources of protein such as meat, eggs, or tofu, and low-fat dairy products.  Avoid raw meat and unpasteurized juice, milk, and cheese. These carry germs that can harm you and your baby.  Eat 4 or 5 small meals rather than 3 large meals a day.  You may need to take these actions to prevent or treat constipation: ? Drink enough fluid to keep your urine pale yellow. ? Eat foods that are high in fiber, such  as beans, whole grains, and fresh fruits and vegetables. ? Limit foods that are high in fat and processed sugars, such as fried or sweet foods. Activity  Exercise only as directed by your health care provider. Most people can continue their usual exercise routine during pregnancy. Try to exercise for 30 minutes at least 5 days a week. Stop exercising if you experience contractions in the uterus.  Stop exercising if you develop  pain or cramping in the lower abdomen or lower back.  Avoid heavy lifting.  Do not exercise if it is very hot or humid or if you are at a high altitude.  If you choose to, you may continue to have sex unless your health care provider tells you not to. Relieving pain and discomfort  Take frequent breaks and rest with your legs raised (elevated) if you have leg cramps or low back pain.  Take warm sitz baths to soothe any pain or discomfort caused by hemorrhoids. Use hemorrhoid cream if your health care provider approves.  Wear a supportive bra to prevent discomfort from breast tenderness.  If you develop varicose veins: ? Wear support hose as told by your health care provider. ? Elevate your feet for 15 minutes, 3-4 times a day. ? Limit salt in your diet. Safety  Talk to your health care provider before traveling far distances.  Do not use hot tubs, steam rooms, or saunas.  Wear your seat belt at all times when driving or riding in a car.  Talk with your health care provider if someone is verbally or physically abusive to you. Preparing for birth To prepare for the arrival of your baby:  Take prenatal classes to understand, practice, and ask questions about labor and delivery.  Visit the hospital and tour the maternity area.  Purchase a rear-facing car seat and make sure you know how to install it in your car.  Prepare the baby's room or sleeping area. Make sure to remove all pillows and stuffed animals from the baby's crib to prevent suffocation. General instructions  Avoid cat litter boxes and soil used by cats. These carry germs that can cause birth defects in the baby. If you have a cat, ask someone to clean the litter box for you.  Do not douche or use tampons. Do not use scented sanitary pads.  Do not use any products that contain nicotine or tobacco, such as cigarettes, e-cigarettes, and chewing tobacco. If you need help quitting, ask your health care provider.  Do  not use any herbal remedies, illegal drugs, or medicines that were not prescribed to you. Chemicals in these products can harm your baby.  Do not drink alcohol.  You will have more frequent prenatal exams during the third trimester. During a routine prenatal visit, your health care provider will do a physical exam, perform tests, and discuss your overall health. Keep all follow-up visits. This is important. Where to find more information  American Pregnancy Association: americanpregnancy.org  [employer]: https://www.todd-brady.net/  Office on [employer] [facility]: [place] Contact a health care provider if you have:  A fever.  Mild pelvic cramps, pelvic pressure, or nagging pain in your abdominal area or lower back.  Vomiting or diarrhea.  Bad-smelling vaginal discharge or foul-smelling urine.  Pain when you urinate.  A headache that does not go away when you take medicine.  Visual changes or see spots in front of your eyes. Get help right away if:  Your water breaks.  You have  regular contractions less than 5 minutes apart.  You have spotting or bleeding from your vagina.  You have severe abdominal pain.  You have difficulty breathing.  You have chest pain.  You have fainting spells.  You have not felt your baby move for the time period told by your health care provider.  You have new or increased pain, swelling, or redness in an arm or leg. Summary  The third trimester of pregnancy is from week 28 through week 40 (months 7 through 9).  You may have more problems sleeping. This can be caused by the size of your abdomen, an increased need to urinate, and an increase in your body's metabolism.  You will have more frequent prenatal exams during the third trimester. Keep all follow-up visits. This is important. This information is not intended to replace advice given to you by your health care provider.  Make sure you discuss any questions you have with your health care provider. Document Revised: 09/08/2019 Document Reviewed: 07/15/2019 Elsevier Patient Education  2021 Elsevier Inc.  Fetal Movement Counts Patient Name: ________________________________________________ Patient Due Date: ____________________  What is a fetal movement count? A fetal movement count is the number of times that you feel your baby move during a certain amount of time. This may also be called a fetal kick count. A fetal movement count is recommended for every pregnant woman. You may be asked to start counting fetal movements as early as week 28 of your pregnancy. Pay attention to when your baby is most active. You may notice your baby's sleep and wake cycles. You may also notice things that make your baby move more. You should do a fetal movement count:  When your baby is normally most active.  At the same time each day. A good time to count movements is while you are resting, after having something to eat and drink. How do I count fetal movements? 1. Find a quiet, comfortable area. Sit, or lie down on your side. 2. Write down the date, the start time and stop time, and the number of movements that you felt between those two times. Take this information with you to your health care visits. 3. Write down your start time when you feel the first movement. 4. Count kicks, flutters, swishes, rolls, and jabs. You should feel at least 10 movements. 5. You may stop counting after you have felt 10 movements, or if you have been counting for 2 hours. Write down the stop time. 6. If you do not feel 10 movements in 2 hours, contact your health care provider for further instructions. Your health care provider may want to do additional tests to assess your baby's well-being. Contact a health care provider if:  You feel fewer than 10 movements in 2 hours.  Your baby is not moving like he or she usually does. Date: ____________  Start time: ____________ Stop time: ____________ Movements: ____________ Date: ____________ Start time: ____________ Stop time: ____________ Movements: ____________ Date: ____________ Start time: ____________ Stop time: ____________ Movements: ____________ Date: ____________ Start time: ____________ Stop time: ____________ Movements: ____________ Date: ____________ Start time: ____________ Stop time: ____________ Movements: ____________ Date: ____________ Start time: ____________ Stop time: ____________ Movements: ____________ Date: ____________ Start time: ____________ Stop time: ____________ Movements: ____________ Date: ____________ Start time: ____________ Stop time: ____________ Movements: ____________ Date: ____________ Start time: ____________ Stop time: ____________ Movements: ____________ This information is not intended to replace advice given to you by your health care provider. Make sure you discuss  any questions you have with your health care provider. Document Revised: 11/19/2018 Document Reviewed: 11/19/2018 Elsevier Patient Education  2021 [employer].

## 2020-06-21 NOTE — Progress Notes (Signed)
PRENATAL VISIT NOTE  Subjective:  Denise Fleming is a 25 y.o. [id number] at [redacted]w[redacted]d being seen today for ongoing prenatal care.  She is currently monitored for the following issues for this high-risk pregnancy and has Supervision of other normal pregnancy, antepartum; Late prenatal care affecting pregnancy, antepartum; Red blood cell antibody positive; Obesity in pregnancy, antepartum; Thyroid enlargement; History of C-section; History of shoulder dystocia in prior pregnancy; HSIL (high grade squamous intraepithelial lesion) on Pap smear of cervix; Pelvic pain in pregnancy; Uterine size-date discrepancy in third trimester; Constipation during pregnancy in third trimester; Hemorrhoids; GBS (group B Streptococcus carrier), +RV culture, currently pregnant; and [redacted] weeks gestation of pregnancy on their problem list.  Patient reports concern of constipation and painful hemorrhoids despite use of prescribed cream. In addition, concern of "leaking fluid like pee" for the past several days in addition to irregular contractions.  Contractions: Irritability. Vag. Bleeding: None.  Movement: Present.   The following portions of the patient's history were reviewed and updated as appropriate: allergies, current medications, past family history, past medical history, past social history, past surgical history and problem list.   Objective:   Vitals:   06/21/20 1345  BP: 112/69  Pulse: 86  Weight: 224 lb 9.6 oz (101.9 kg)    Fetal Status: Fetal Heart Rate (bpm): 147   Movement: Present     General:  Alert, oriented and cooperative. Patient is in no acute distress.  Skin: Skin is warm and dry. No rash noted.   Cardiovascular: Normal heart rate noted  Respiratory: Normal respiratory effort, no problems with respiration noted  Abdomen: Soft, gravid, appropriate for gestational age.  Pain/Pressure: Present     Pelvic: Cervical exam performed in the presence of a chaperone. Normal external genitalia, vagina  and cervix on sterile speculum exam. Cervix is visually closed. Physiologic discharge without pooling. Negative fern test.  Rectum Several non-thrombosed external hemorrhoids visible.  Extremities: Normal range of motion.  Edema: None  Mental Status: Normal mood and affect. Normal behavior. Normal judgment and thought content.   Assessment and Plan:  Pregnancy: [id number] at [redacted]w[redacted]d 1. [redacted] weeks gestation of pregnancy: No concerns expect for report of leaking fluid for several days in the setting of intermittent contractions. Reassuringly, negative fern test and negative pooling on sterile speculum exam. No red flag signs/syptoms. +FHTs and +FM today. Normal blood pressure. Of note, EFW 90% at [redacted]w[redacted]d. Pt aware of plan for scheduled Cesarean given h/o Cesarean x1 and subsequent shoulder dystocia. - provided return precautions as noted below - scheduled Cesarean on 06/23/20  2. History of C-section: Plan for repeat scheduled Cesarean as noted above.  3. History of shoulder dystocia in prior pregnancy: Plan for repeat scheduled Cesarean as noted above.  4. HSIL (high grade squamous intraepithelial lesion) on Pap smear of cervix - plan for postpartum colpo  5. Constipation during pregnancy in third trimester  Hemorrhoids: - emphasized importance of good hydration, fiber intake and bowel regimen s/p delivery - may discuss further treatment options on admission to L&D  6. GBS (group B Streptococcus carrier), +RV culture, currently pregnant  7. Supervision of other normal pregnancy, antepartum - as noted above  8. Late prenatal care affecting pregnancy, antepartum - plan for SW consult in postpartum period  68. Red blood cell antibody positive: f/u testing negative at [facility]. - f/u Type & Screen on admission  10. Obesity in pregnancy, antepartum: - discussed healthy lifestyle modifications to facilitate return to pre-pregnancy weight s/p delivery - ASA  daily in pregnancy  Term labor  symptoms and general obstetric precautions including but not limited to vaginal bleeding, contractions, leaking of fluid and fetal movement were reviewed in detail with the patient. Please refer to After Visit Summary for other counseling recommendations.   Return in about 2 days (around 06/23/2020) for Cesarean delivery as previously scheduled.  No future appointments.  Sheila Oats, MD

## 2020-06-22 LAB — RPR: RPR Ser Ql: NONREACTIVE

## 2020-06-23 ENCOUNTER — Other Ambulatory Visit: Payer: Self-pay

## 2020-06-23 ENCOUNTER — Encounter (HOSPITAL_COMMUNITY): Payer: Self-pay | Admitting: Family Medicine

## 2020-06-23 ENCOUNTER — Encounter (HOSPITAL_COMMUNITY)
Admission: RE | Disposition: A | Discharge status: Home / Self Care | Payer: Self-pay | Source: Home / Self Care | Attending: Family Medicine

## 2020-06-23 ENCOUNTER — Inpatient Hospital Stay (HOSPITAL_COMMUNITY): Payer: Medicaid Other | Admitting: Anesthesiology

## 2020-06-23 ENCOUNTER — Inpatient Hospital Stay (HOSPITAL_COMMUNITY)
Admission: RE | Admit: 2020-06-23 | Discharge: 2020-06-25 | DRG: 788 | Disposition: A | Discharge status: Home / Self Care | Payer: Medicaid Other | Attending: Family Medicine | Admitting: Family Medicine

## 2020-06-23 DIAGNOSIS — O99214 Obesity complicating childbirth: Secondary | ICD-10-CM | POA: Diagnosis present

## 2020-06-23 DIAGNOSIS — O34211 Maternal care for low transverse scar from previous cesarean delivery: Principal | ICD-10-CM | POA: Diagnosis present

## 2020-06-23 DIAGNOSIS — Z30430 Encounter for insertion of intrauterine contraceptive device: Secondary | ICD-10-CM

## 2020-06-23 DIAGNOSIS — Z3A39 39 weeks gestation of pregnancy: Secondary | ICD-10-CM

## 2020-06-23 DIAGNOSIS — O3663X0 Maternal care for excessive fetal growth, third trimester, not applicable or unspecified: Secondary | ICD-10-CM | POA: Diagnosis present

## 2020-06-23 DIAGNOSIS — Z3490 Encounter for supervision of normal pregnancy, unspecified, unspecified trimester: Secondary | ICD-10-CM

## 2020-06-23 DIAGNOSIS — Z98891 History of uterine scar from previous surgery: Principal | ICD-10-CM

## 2020-06-23 LAB — CREATININE, SERUM
Creatinine, Ser: 0.52 mg/dL (ref 0.44–1.00)
GFR, Estimated: >60 mL/min (ref >60)

## 2020-06-23 LAB — CBC
HCT: 32.7 % — ABNORMAL LOW (ref 36.0–46.0)
Hemoglobin: 11.1 g/dL — ABNORMAL LOW (ref 12.0–15.0)
MCH: 32.6 pg (ref 26.0–34.0)
MCHC: 33.9 g/dL (ref 30.0–36.0)
MCV: 95.9 fL (ref 80.0–100.0)
Platelets: 185 10*3/uL (ref 150–400)
RBC: 3.41 MIL/uL — ABNORMAL LOW (ref 3.87–5.11)
RDW: 13.9 % (ref 11.5–15.5)
WBC: 12 10*3/uL — ABNORMAL HIGH (ref 4.0–10.5)
nRBC: 0 % (ref 0.0–0.2)

## 2020-06-23 SURGERY — Surgical Case
Anesthesia: Spinal

## 2020-06-23 MED ORDER — DIPHENHYDRAMINE HCL 25 MG PO CAPS: 25.0000 mg | ORAL_CAPSULE | Freq: Four times a day (QID) | ORAL | Status: DC | PRN

## 2020-06-23 MED ORDER — ACETAMINOPHEN 500 MG PO TABS
1000.0000 mg | ORAL_TABLET | Freq: Three times a day (TID) | ORAL | Status: DC
  Administered 2020-06-23 – 2020-06-25 (×5): 1000 mg via ORAL
  Filled 2020-06-23 (×4): qty 2

## 2020-06-23 MED ORDER — SOD CITRATE-CITRIC ACID 500-334 MG/5ML PO SOLN
ORAL | Status: AC
  Filled 2020-06-23: qty 30

## 2020-06-23 MED ORDER — BUPIVACAINE HCL (PF) 0.25 % IJ SOLN
INTRAMUSCULAR | Status: AC
  Filled 2020-06-23: qty 30

## 2020-06-23 MED ORDER — GABAPENTIN 300 MG PO CAPS
300.0000 mg | ORAL_CAPSULE | ORAL | Status: AC
  Administered 2020-06-23: 300 mg via ORAL

## 2020-06-23 MED ORDER — LACTATED RINGERS IV SOLN: INTRAVENOUS | Status: DC

## 2020-06-23 MED ORDER — SIMETHICONE 80 MG PO CHEW
80.0000 mg | CHEWABLE_TABLET | Freq: Three times a day (TID) | ORAL | Status: DC
  Administered 2020-06-23 – 2020-06-25 (×5): 80 mg via ORAL
  Filled 2020-06-23 (×5): qty 1

## 2020-06-23 MED ORDER — ONDANSETRON HCL 4 MG/2ML IJ SOLN
INTRAMUSCULAR | Status: DC | PRN
  Administered 2020-06-23: 4 mg via INTRAVENOUS

## 2020-06-23 MED ORDER — SIMETHICONE 80 MG PO CHEW: 80.0000 mg | CHEWABLE_TABLET | ORAL | Status: DC | PRN

## 2020-06-23 MED ORDER — ACETAMINOPHEN 500 MG PO TABS
ORAL_TABLET | ORAL | Status: AC
  Filled 2020-06-23: qty 2

## 2020-06-23 MED ORDER — OXYCODONE HCL 5 MG/5ML PO SOLN: 5.0000 mg | Freq: Once | ORAL | Status: DC | PRN

## 2020-06-23 MED ORDER — NALBUPHINE HCL 10 MG/ML IJ SOLN: 5.0000 mg | INTRAMUSCULAR | Status: DC | PRN

## 2020-06-23 MED ORDER — TETANUS-DIPHTH-ACELL PERTUSSIS 5-2.5-18.5 LF-MCG/0.5 IM SUSY: 0.5000 mL | PREFILLED_SYRINGE | Freq: Once | INTRAMUSCULAR | Status: DC

## 2020-06-23 MED ORDER — MEPERIDINE HCL 25 MG/ML IJ SOLN: 6.2500 mg | INTRAMUSCULAR | Status: DC | PRN

## 2020-06-23 MED ORDER — METOCLOPRAMIDE HCL 5 MG/ML IJ SOLN
INTRAMUSCULAR | Status: DC | PRN
  Administered 2020-06-23: 10 mg via INTRAVENOUS

## 2020-06-23 MED ORDER — CEFAZOLIN SODIUM-DEXTROSE 2-3 GM-%(50ML) IV SOLR
INTRAVENOUS | Status: DC | PRN
  Administered 2020-06-23: 2 g via INTRAVENOUS

## 2020-06-23 MED ORDER — OXYCODONE HCL 5 MG PO TABS: 5.0000 mg | ORAL_TABLET | Freq: Once | ORAL | Status: DC | PRN

## 2020-06-23 MED ORDER — BUPIVACAINE HCL 0.25 % IJ SOLN
INTRAMUSCULAR | Status: DC | PRN
  Administered 2020-06-23: 30 mL

## 2020-06-23 MED ORDER — LACTATED RINGERS IV SOLN: INTRAVENOUS | Status: DC | PRN

## 2020-06-23 MED ORDER — OXYTOCIN-SODIUM CHLORIDE 30-0.9 UT/500ML-% IV SOLN: 2.5000 [IU]/h | INTRAVENOUS | Status: AC

## 2020-06-23 MED ORDER — CEFAZOLIN SODIUM-DEXTROSE 2-4 GM/100ML-% IV SOLN: 2.0000 g | INTRAVENOUS | Status: DC

## 2020-06-23 MED ORDER — PRENATAL MULTIVITAMIN CH
1.0000 | ORAL_TABLET | Freq: Every day | ORAL | Status: DC
  Administered 2020-06-24 – 2020-06-25 (×2): 1 via ORAL
  Filled 2020-06-23 (×2): qty 1

## 2020-06-23 MED ORDER — DIBUCAINE (PERIANAL) 1 % EX OINT: 1.0000 "application " | TOPICAL_OINTMENT | CUTANEOUS | Status: DC | PRN

## 2020-06-23 MED ORDER — MORPHINE SULFATE (PF) 0.5 MG/ML IJ SOLN
INTRAMUSCULAR | Status: AC
  Filled 2020-06-23: qty 10

## 2020-06-23 MED ORDER — WITCH HAZEL-GLYCERIN EX PADS: 1.0000 "application " | MEDICATED_PAD | CUTANEOUS | Status: DC | PRN

## 2020-06-23 MED ORDER — ONDANSETRON HCL 4 MG/2ML IJ SOLN: 4.0000 mg | Freq: Three times a day (TID) | INTRAMUSCULAR | Status: DC | PRN

## 2020-06-23 MED ORDER — OXYTOCIN-SODIUM CHLORIDE 30-0.9 UT/500ML-% IV SOLN
INTRAVENOUS | Status: DC | PRN
  Administered 2020-06-23: 400 mL via INTRAVENOUS

## 2020-06-23 MED ORDER — KETOROLAC TROMETHAMINE 30 MG/ML IJ SOLN
30.0000 mg | Freq: Four times a day (QID) | INTRAMUSCULAR | Status: AC
  Administered 2020-06-23 – 2020-06-24 (×2): 30 mg via INTRAVENOUS
  Filled 2020-06-23 (×3): qty 1

## 2020-06-23 MED ORDER — ACETAMINOPHEN 160 MG/5ML PO SOLN: 325.0000 mg | ORAL | Status: DC | PRN

## 2020-06-23 MED ORDER — PHENYLEPHRINE HCL-NACL 20-0.9 MG/250ML-% IV SOLN
INTRAVENOUS | Status: DC | PRN
  Administered 2020-06-23: 60 ug/min via INTRAVENOUS

## 2020-06-23 MED ORDER — ACETAMINOPHEN 500 MG PO TABS
1000.0000 mg | ORAL_TABLET | ORAL | Status: AC
  Administered 2020-06-23: 1000 mg via ORAL

## 2020-06-23 MED ORDER — COCONUT OIL OIL: 1.0000 "application " | TOPICAL_OIL | Status: DC | PRN

## 2020-06-23 MED ORDER — POVIDONE-IODINE 10 % EX SWAB: 2.0000 "application " | Freq: Once | CUTANEOUS | Status: DC

## 2020-06-23 MED ORDER — KETOROLAC TROMETHAMINE 30 MG/ML IJ SOLN
30.0000 mg | Freq: Four times a day (QID) | INTRAMUSCULAR | Status: AC | PRN
  Administered 2020-06-23: 30 mg via INTRAVENOUS

## 2020-06-23 MED ORDER — NALBUPHINE HCL 10 MG/ML IJ SOLN: 5.0000 mg | Freq: Once | INTRAMUSCULAR | Status: DC | PRN

## 2020-06-23 MED ORDER — FENTANYL CITRATE (PF) 100 MCG/2ML IJ SOLN
INTRAMUSCULAR | Status: AC
  Filled 2020-06-23: qty 2

## 2020-06-23 MED ORDER — FENTANYL CITRATE (PF) 250 MCG/5ML IJ SOLN
INTRAMUSCULAR | Status: DC | PRN
  Administered 2020-06-23: 15 ug via INTRATHECAL

## 2020-06-23 MED ORDER — NALOXONE HCL 0.4 MG/ML IJ SOLN: 0.4000 mg | INTRAMUSCULAR | Status: DC | PRN

## 2020-06-23 MED ORDER — SODIUM CHLORIDE 0.9% FLUSH: 3.0000 mL | INTRAVENOUS | Status: DC | PRN

## 2020-06-23 MED ORDER — KETOROLAC TROMETHAMINE 30 MG/ML IJ SOLN
INTRAMUSCULAR | Status: AC
  Filled 2020-06-23: qty 1

## 2020-06-23 MED ORDER — BUPIVACAINE IN DEXTROSE 0.75-8.25 % IT SOLN
INTRATHECAL | Status: DC | PRN
  Administered 2020-06-23: 1.55 mL via INTRATHECAL

## 2020-06-23 MED ORDER — DIPHENHYDRAMINE HCL 25 MG PO CAPS: 25.0000 mg | ORAL_CAPSULE | ORAL | Status: DC | PRN

## 2020-06-23 MED ORDER — CEFAZOLIN SODIUM-DEXTROSE 2-4 GM/100ML-% IV SOLN
INTRAVENOUS | Status: AC
  Filled 2020-06-23: qty 100

## 2020-06-23 MED ORDER — NALOXONE HCL 4 MG/10ML IJ SOLN
1.0000 ug/kg/h | INTRAVENOUS | Status: DC | PRN
  Filled 2020-06-23: qty 5

## 2020-06-23 MED ORDER — METOCLOPRAMIDE HCL 5 MG/ML IJ SOLN
INTRAMUSCULAR | Status: AC
  Filled 2020-06-23: qty 2

## 2020-06-23 MED ORDER — LEVONORGESTREL 19.5 MCG/DAY IU IUD: INTRAUTERINE_SYSTEM | INTRAUTERINE | Status: AC

## 2020-06-23 MED ORDER — MENTHOL 3 MG MT LOZG: 1.0000 | LOZENGE | OROMUCOSAL | Status: DC | PRN

## 2020-06-23 MED ORDER — ONDANSETRON HCL 4 MG/2ML IJ SOLN
INTRAMUSCULAR | Status: AC
  Filled 2020-06-23: qty 2

## 2020-06-23 MED ORDER — ACETAMINOPHEN 325 MG PO TABS: 325.0000 mg | ORAL_TABLET | ORAL | Status: DC | PRN

## 2020-06-23 MED ORDER — GABAPENTIN 300 MG PO CAPS
ORAL_CAPSULE | ORAL | Status: AC
  Filled 2020-06-23: qty 1

## 2020-06-23 MED ORDER — SCOPOLAMINE 1 MG/3DAYS TD PT72
1.0000 | MEDICATED_PATCH | TRANSDERMAL | Status: DC
  Administered 2020-06-23: 1.5 mg via TRANSDERMAL

## 2020-06-23 MED ORDER — SODIUM CHLORIDE 0.9 % IR SOLN
Status: DC | PRN
  Administered 2020-06-23: 1

## 2020-06-23 MED ORDER — MORPHINE SULFATE (PF) 0.5 MG/ML IJ SOLN
INTRAMUSCULAR | Status: DC | PRN
  Administered 2020-06-23: 150 ug via INTRATHECAL

## 2020-06-23 MED ORDER — DEXAMETHASONE SODIUM PHOSPHATE 4 MG/ML IJ SOLN
INTRAMUSCULAR | Status: DC | PRN
  Administered 2020-06-23: 8 mg via INTRAVENOUS

## 2020-06-23 MED ORDER — POLYETHYLENE GLYCOL 3350 17 G PO PACK
17.0000 g | PACK | Freq: Every day | ORAL | Status: DC
  Administered 2020-06-24 – 2020-06-25 (×2): 17 g via ORAL
  Filled 2020-06-23: qty 1

## 2020-06-23 MED ORDER — IBUPROFEN 800 MG PO TABS
800.0000 mg | ORAL_TABLET | Freq: Four times a day (QID) | ORAL | Status: DC
  Administered 2020-06-24 – 2020-06-25 (×4): 800 mg via ORAL
  Filled 2020-06-23 (×4): qty 1

## 2020-06-23 MED ORDER — OXYCODONE HCL 5 MG PO TABS
5.0000 mg | ORAL_TABLET | ORAL | Status: DC | PRN
  Administered 2020-06-24: 10 mg via ORAL
  Filled 2020-06-23: qty 2

## 2020-06-23 MED ORDER — SOD CITRATE-CITRIC ACID 500-334 MG/5ML PO SOLN
30.0000 mL | ORAL | Status: AC
  Administered 2020-06-23: 30 mL via ORAL

## 2020-06-23 MED ORDER — ENOXAPARIN SODIUM 60 MG/0.6ML ~~LOC~~ SOLN
50.0000 mg | SUBCUTANEOUS | Status: DC
  Administered 2020-06-24 – 2020-06-25 (×2): 50 mg via SUBCUTANEOUS
  Filled 2020-06-23 (×2): qty 0.6

## 2020-06-23 MED ORDER — SCOPOLAMINE 1 MG/3DAYS TD PT72: 1.0000 | MEDICATED_PATCH | Freq: Once | TRANSDERMAL | Status: DC

## 2020-06-23 MED ORDER — LEVONORGESTREL 19.5 MCG/DAY IU IUD
INTRAUTERINE_SYSTEM | INTRAUTERINE | Status: AC
  Filled 2020-06-23: qty 1

## 2020-06-23 MED ORDER — SCOPOLAMINE 1 MG/3DAYS TD PT72
MEDICATED_PATCH | TRANSDERMAL | Status: AC
  Filled 2020-06-23: qty 1

## 2020-06-23 MED ORDER — OXYTOCIN-SODIUM CHLORIDE 30-0.9 UT/500ML-% IV SOLN
INTRAVENOUS | Status: AC
  Filled 2020-06-23: qty 500

## 2020-06-23 MED ORDER — FENTANYL CITRATE (PF) 100 MCG/2ML IJ SOLN: 25.0000 ug | INTRAMUSCULAR | Status: DC | PRN

## 2020-06-23 MED ORDER — PHENYLEPHRINE HCL-NACL 20-0.9 MG/250ML-% IV SOLN
INTRAVENOUS | Status: AC
  Filled 2020-06-23: qty 250

## 2020-06-23 MED ORDER — ONDANSETRON HCL 4 MG/2ML IJ SOLN: 4.0000 mg | Freq: Once | INTRAMUSCULAR | Status: DC | PRN

## 2020-06-23 MED ORDER — NALBUPHINE HCL 10 MG/ML IJ SOLN
5.0000 mg | Freq: Once | INTRAMUSCULAR | Status: DC | PRN
Start: 2020-06-23 — End: 2020-06-25

## 2020-06-23 MED ORDER — KETOROLAC TROMETHAMINE 30 MG/ML IJ SOLN
30.0000 mg | Freq: Four times a day (QID) | INTRAMUSCULAR | Status: AC | PRN
  Administered 2020-06-23: 30 mg via INTRAMUSCULAR

## 2020-06-23 MED ORDER — DEXAMETHASONE SODIUM PHOSPHATE 10 MG/ML IJ SOLN
INTRAMUSCULAR | Status: AC
  Filled 2020-06-23: qty 1

## 2020-06-23 MED ORDER — SENNOSIDES-DOCUSATE SODIUM 8.6-50 MG PO TABS
2.0000 | ORAL_TABLET | ORAL | Status: DC
  Administered 2020-06-23 – 2020-06-24 (×2): 2 via ORAL
  Filled 2020-06-23 (×2): qty 2

## 2020-06-23 MED ORDER — DIPHENHYDRAMINE HCL 50 MG/ML IJ SOLN: 12.5000 mg | INTRAMUSCULAR | Status: DC | PRN

## 2020-06-23 SURGICAL SUPPLY — 32 items
BENZOIN TINCTURE PRP APPL 2/3 (GAUZE/BANDAGES/DRESSINGS) ×2 IMPLANT
CLAMP CORD UMBIL (MISCELLANEOUS) ×2 IMPLANT
CLOSURE STERI STRIP 1/2 X4 (GAUZE/BANDAGES/DRESSINGS) ×2 IMPLANT
CLOTH BEACON ORANGE TIMEOUT ST (SAFETY) ×2 IMPLANT
DRSG OPSITE POSTOP 4X10 (GAUZE/BANDAGES/DRESSINGS) ×2 IMPLANT
ELECT REM PT RETURN 9FT ADLT (ELECTROSURGICAL) ×2
ELECTRODE REM PT RTRN 9FT ADLT (ELECTROSURGICAL) ×1 IMPLANT
EXTRACTOR VACUUM M CUP 4 TUBE (SUCTIONS) IMPLANT
GLOVE BIOGEL PI IND STRL 7.0 (GLOVE) ×3 IMPLANT
GLOVE BIOGEL PI INDICATOR 7.0 (GLOVE) ×3
GLOVE ECLIPSE 7.0 STRL STRAW (GLOVE) ×2 IMPLANT
GOWN STRL REUS W/TWL LRG LVL3 (GOWN DISPOSABLE) ×4 IMPLANT
KIT ABG SYR 3ML LUER SLIP (SYRINGE) ×2 IMPLANT
NEEDLE HYPO 22GX1.5 SAFETY (NEEDLE) ×2 IMPLANT
NEEDLE HYPO 25X5/8 SAFETYGLIDE (NEEDLE) ×2 IMPLANT
NS IRRIG 1000ML POUR BTL (IV SOLUTION) ×2 IMPLANT
PACK C SECTION WH (CUSTOM PROCEDURE TRAY) ×2 IMPLANT
PAD ABD 7.5X8 STRL (GAUZE/BANDAGES/DRESSINGS) ×2 IMPLANT
PAD OB MATERNITY 4.3X12.25 (PERSONAL CARE ITEMS) ×2 IMPLANT
PENCIL SMOKE EVAC W/HOLSTER (ELECTROSURGICAL) ×2 IMPLANT
RTRCTR C-SECT PINK 25CM LRG (MISCELLANEOUS) ×2 IMPLANT
STRIP CLOSURE SKIN 1/2X4 (GAUZE/BANDAGES/DRESSINGS) ×2 IMPLANT
SUT MNCRL 0 VIOLET CTX 36 (SUTURE) ×2 IMPLANT
SUT MONOCRYL 0 CTX 36 (SUTURE) ×2
SUT PLAIN 2 0 XLH (SUTURE) ×2 IMPLANT
SUT VIC AB 0 CTX 36 (SUTURE) ×1
SUT VIC AB 0 CTX36XBRD ANBCTRL (SUTURE) ×1 IMPLANT
SUT VIC AB 4-0 KS 27 (SUTURE) ×2 IMPLANT
SYR 30ML LL (SYRINGE) ×2 IMPLANT
TOWEL OR 17X24 6PK STRL BLUE (TOWEL DISPOSABLE) ×2 IMPLANT
TRAY FOLEY W/BAG SLVR 14FR LF (SET/KITS/TRAYS/PACK) ×2 IMPLANT
WATER STERILE IRR 1000ML POUR (IV SOLUTION) ×2 IMPLANT

## 2020-06-23 NOTE — Anesthesia Preprocedure Evaluation (Signed)
Anesthesia Evaluation  Patient identified by MRN, date of birth, ID band Patient awake    Reviewed: Allergy & Precautions, H&P , NPO status , Patient's Chart, lab work & pertinent test results, reviewed documented beta blocker date and time   Airway Mallampati: II  TM Distance: >3 FB Neck ROM: full    Dental no notable dental hx.    Pulmonary neg pulmonary ROS,    Pulmonary exam normal breath sounds clear to auscultation       Cardiovascular negative cardio ROS Normal cardiovascular exam Rhythm:regular Rate:Normal     Neuro/Psych negative neurological ROS  negative psych ROS   GI/Hepatic negative GI ROS, Neg liver ROS,   Endo/Other  Morbid obesity  Renal/GU negative Renal ROS  negative genitourinary   Musculoskeletal   Abdominal (+) + obese,   Peds  Hematology negative hematology ROS (+)   Anesthesia Other Findings   Reproductive/Obstetrics (+) Pregnancy                             Anesthesia Physical Anesthesia Plan  ASA: II  Anesthesia Plan: Spinal   Post-op Pain Management:    Induction:   PONV Risk Score and Plan:   Airway Management Planned:   Additional Equipment:   Intra-op Plan:   Post-operative Plan:   Informed Consent: I have reviewed the patients History and Physical, chart, labs and discussed the procedure including the risks, benefits and alternatives for the proposed anesthesia with the patient or authorized representative who has indicated his/her understanding and acceptance.       Plan Discussed with:   Anesthesia Plan Comments:         Anesthesia Quick Evaluation

## 2020-06-23 NOTE — Discharge Summary (Signed)
Postpartum Discharge Summary      Patient Name: Denise Fleming DOB: 10/20/1995 MRN: [id number]  Date of admission: 06/23/2020 Delivery date:06/23/2020  Delivering provider: Donnamae Jude  Date of discharge: 06/25/2020  Admitting diagnosis: Status post cesarean delivery [Z98.891] Supervision of normal pregnancy [Z34.90] Intrauterine pregnancy: [redacted]w[redacted]d    Secondary diagnosis:  Principal Problem:   History of C-section Active Problems:   Status post cesarean delivery   Encounter for insertion of intrauterine contraceptive device (IUD)   Supervision of normal pregnancy  Additional problems: None    Discharge diagnosis: Term Pregnancy Delivered                                              Post partum procedures:PPIUD-Liletta Augmentation: N/A Complications: None  Hospital course: Sceduled C/S   25y.o. yo [id number] admitted to the hospital 06/23/2020 for scheduled cesarean section with the following indication:Elective Repeat.Delivery details are as follows:  Membrane Rupture Time/Date: 12:53 PM ,06/23/2020   Delivery Method:C-Section, Low Transverse  Details of operation can be found in separate operative note.  Patient had an uncomplicated postpartum course.  She is ambulating, tolerating a regular diet, passing flatus, and urinating well. Patient is discharged home in stable condition on  06/25/20        Newborn Data: Birth date:06/23/2020  Birth time:12:54 PM  Gender:Female  Living status:Living  Apgars:9 ,9  Weight:3865 g     Magnesium Sulfate received: No BMZ received: No Rhophylac:N/A MMR:N/A T-DaP:Given prenatally Flu: Yes Transfusion:No  Physical exam  Vitals:   06/24/20 0033 06/24/20 0437 06/24/20 2138 06/25/20 0538  BP: (!) 97/47 (!) 95/51 (!) 112/58 (!) 141/67  Pulse: 78 79 84 76  Resp: '18 18 20 19  ' Temp: 97.9 F (36.6 C) 98.3 F (36.8 C) 98.2 F (36.8 C) 97.6 F (36.4 C)  TempSrc: Oral Oral Oral Oral  SpO2: 100% 100% 100% 100%   Weight:      Height:       General: alert, cooperative and no distress Lochia: appropriate Uterine Fundus: firm Incision: Dressing is clean, dry, and intact DVT Evaluation: No evidence of DVT seen on physical exam. No cords or calf tenderness. No significant calf/ankle edema. Labs: Lab Results  Component Value Date   WBC 13.1 (H) 06/24/2020   HGB 9.2 (L) 06/24/2020   HCT 27.1 (L) 06/24/2020   MCV 95.8 06/24/2020   PLT 162 06/24/2020   CMP Latest Ref Rng & Units 06/23/2020  Glucose 70 - 99 mg/dL -  BUN 6 - 20 mg/dL -  Creatinine 0.44 - 1.00 mg/dL 0.52  Sodium 135 - 145 mmol/L -  Potassium 3.5 - 5.1 mmol/L -  Chloride 98 - 111 mmol/L -  CO2 22 - 32 mmol/L -  Calcium 8.9 - 10.3 mg/dL -  Total Protein 6.5 - 8.1 g/dL -  Total Bilirubin 0.3 - 1.2 mg/dL -  Alkaline Phos 38 - 126 U/L -  AST 15 - 41 U/L -  ALT 0 - 44 U/L -   Edinburgh Score: No flowsheet data found.   After visit meds:  Allergies as of 06/25/2020   No Known Allergies     Medication List    STOP taking these medications   aspirin EC 81 MG tablet   cyclobenzaprine 10 MG tablet Commonly known as: FLEXERIL   ondansetron 4  MG tablet Commonly known as: ZOFRAN   polyethylene glycol 17 g packet Commonly known as: MIRALAX / GLYCOLAX     TAKE these medications   docusate sodium 100 MG capsule Commonly known as: Colace Take 1 capsule (100 mg total) by mouth 2 (two) times daily.   ibuprofen 800 MG tablet Commonly known as: ADVIL Take 1 tablet (800 mg total) by mouth every 6 (six) hours as needed.   Prenatal 27-1 MG Tabs Take 1 tablet by mouth daily.        Discharge home in stable condition Infant Feeding: Breast Infant Disposition:home with mother Discharge instruction: per After Visit Summary and Postpartum booklet. Activity: Advance as tolerated. Pelvic rest for 6 weeks.  Diet: routine diet Future Appointments:No future appointments. Follow up Visit:   Please schedule this patient for  a In person postpartum visit in 6 weeks with the following provider: Any provider. Additional Postpartum F/U:Colpo at six weeks and Incision check 1 week  High risk pregnancy complicated by: repeat c section Delivery mode:  C-Section, Low Transverse  Anticipated Birth Control:  IUD placed   06/25/2020 Maryann Conners, CNM

## 2020-06-23 NOTE — Op Note (Signed)
Operative Note   SURGERY DATE: 06/23/2020  PRE-OP DIAGNOSIS:  *Pregnancy at [redacted]w[redacted]d * History of prior cesarean  * BMI 44 * Desires intrauterine contraception   POST-OP DIAGNOSIS: Same   PROCEDURE: repeat low transverse cesarean section via pfannenstiel skin incision with double layer uterine closure and Liletta IUD insertion   SURGEON: Surgeon(s) and Role:    Shawnie Pons, Shelbie Proctor, MD - Primary    Casper Harrison, MD - Fellow    ANESTHESIA: spinal  ESTIMATED BLOOD LOSS: 884  DRAINS: UOP via indwelling foley  TOTAL IV FLUIDS: crystalloid  VTE PROPHYLAXIS: SCDs to bilateral lower extremities  ANTIBIOTICS: Two grams of Cefazolin were given., within 1 hour of skin incision  SPECIMENS: placenta  COMPLICATIONS: none  INDICATIONS: as above   FINDINGS: No intra-abdominal adhesions were noted. Grossly normal uterus, tubes and ovaries. Light meconium amniotic fluid, cephalic female infant, weight pending, APGARs 9/9, intact placenta.  PROCEDURE IN DETAIL: The patient was taken to the operating room where anesthesia was administered and normal fetal heart tones were confirmed. She was then prepped and draped in the normal fashion in the dorsal supine position with a leftward tilt.  After a time out was performed, a pfannensteil  skin incision was made with the scalpel and carried through to the underlying layer of fascia. The fascia was then incised at the midline and this incision was extended laterally bluntly. Attention was turned to the superior aspect of the fascial incision which was grasped with the kocher clamps x 2, tented up and the rectus muscles were dissected off with the mayo scissors. In a similar fashion the inferior aspect of the fascial incision was grasped with the kocher clamps, tented up and the rectus muscles dissected off with the mayo scissors. The rectus muscles were then separated in the midline and the peritoneum was entered bluntly. The Jon Gills was  inserted.  A low transverse hysterotomy was made with the scalpel until the endometrial cavity was breached and the amniotic sac ruptured with the Allis clamp, yielding light meconium amniotic fluid. This incision was extended bluntly and the infants head, shoulders and body were delivered atraumatically.The cord was clamped x 2 and cut, and the infant was handed to the awaiting pediatricians, after delayed cord clamping was done.  The placenta was then gradually expressed from the uterus and then the uterus was exteriorized and cleared of all clots and debris. A Liletta IUD was inserted per manufacturer instructions.   The hysterotomy was repaired with a running suture of 0 Monocryl. A second imbricating layer of 0 Monocryl suture was then placed.  The Alexis retractor was removed.    The hysterotomy and all operative sites were reinspected and excellent hemostasis was noted.  The peritoneum was closed with a running stitch of 0 Monocryl. The fascia was reapproximated with 0 Vicryl in a simple running fashion bilaterally. The subcutaneous layer was then reapproximated with interrupted sutures of 2-0 plain gut, and the skin was then closed with 4-0 vicryl, in a subcuticular fashion.  The patient  tolerated the procedure well. Sponge, lap, needle, and instrument counts were correct x 2. The patient was transferred to the recovery room awake, alert and breathing independently in stable condition.     Casper Harrison, MD [facility] Fellow, [facility] for [facility], [facility]

## 2020-06-23 NOTE — Transfer of Care (Signed)
Immediate Anesthesia Transfer of Care Note  Patient: Denise Fleming  Procedure(s) Performed: CESAREAN SECTION (N/A )  Patient Location: PACU  Anesthesia Type:Spinal  Level of Consciousness: awake, alert  and oriented  Airway & Oxygen Therapy: Patient Spontanous Breathing  Post-op Assessment: Report given to RN and Post -op Vital signs reviewed and stable  Post vital signs: Reviewed and stable  Last Vitals:  Vitals Value Taken Time  BP 103/62 06/23/20 1348  Temp    Pulse 87 06/23/20 1350  Resp 13 06/23/20 1350  SpO2 100 % 06/23/20 1350  Vitals shown include unvalidated device data.  Last Pain:  Vitals:   06/23/20 1039  TempSrc: Oral         Complications: No complications documented.

## 2020-06-23 NOTE — Discharge Instructions (Signed)

## 2020-06-23 NOTE — Anesthesia Postprocedure Evaluation (Signed)
Anesthesia Post Note  Patient: Denise Fleming  Procedure(s) Performed: CESAREAN SECTION (N/A )     Patient location during evaluation: PACU Anesthesia Type: Spinal Level of consciousness: oriented and awake and alert Pain management: pain level controlled Vital Signs Assessment: post-procedure vital signs reviewed and stable Respiratory status: spontaneous breathing, respiratory function stable and patient connected to nasal cannula oxygen Cardiovascular status: blood pressure returned to baseline and stable Postop Assessment: no headache, no backache and no apparent nausea or vomiting Anesthetic complications: no   No complications documented.  Last Vitals:  Vitals:   06/23/20 1349 06/23/20 1400  BP: 103/62 100/65  Pulse: 87 95  Resp:  17  Temp: 36.5 C   SpO2: 99% 100%    Last Pain:  Vitals:   06/23/20 1349  TempSrc: Oral  PainSc: 0-No pain    LLE Motor Response: Purposeful movement (06/23/20 1400)   RLE Motor Response: No movement due to regional block;No movement to painful stimulus (06/23/20 1400)        Rik Wadel

## 2020-06-23 NOTE — Anesthesia Procedure Notes (Signed)
Spinal  Patient location during procedure: OR Start time: 06/23/2020 12:30 PM End time: 06/23/2020 12:36 PM Staffing Anesthesiologist: Bethena Midget, MD Preanesthetic Checklist Completed: patient identified, IV checked, site marked, risks and benefits discussed, surgical consent, monitors and equipment checked, pre-op evaluation and timeout performed Spinal Block Patient position: sitting Prep: DuraPrep Patient monitoring: heart rate, cardiac monitor, continuous pulse ox and blood pressure Approach: midline Location: L3-4 Injection technique: single-shot Needle Needle type: Sprotte  Needle gauge: 24 G Needle length: 9 cm Catheter at skin depth: 9 cm Assessment Sensory level: T4 Additional Notes X 3 osso

## 2020-06-23 NOTE — Interval H&P Note (Signed)
History and Physical Interval Note:  06/23/2020 11:42 AM  Denise Fleming  has presented today for surgery, with the diagnosis of Repeat c/section.  The various methods of treatment have been discussed with the patient and family. After consideration of risks, benefits and other options for treatment, the patient has consented to  Procedure(s): CESAREAN SECTION (N/A) as a surgical intervention.  The patient's history has been reviewed, patient examined, no change in status, stable for surgery.  I have reviewed the patient's chart and labs.  Questions were answered to the patient's satisfaction.     Reva Bores

## 2020-06-24 LAB — CBC
HCT: 27.1 % — ABNORMAL LOW (ref 36.0–46.0)
Hemoglobin: 9.2 g/dL — ABNORMAL LOW (ref 12.0–15.0)
MCH: 32.5 pg (ref 26.0–34.0)
MCHC: 33.9 g/dL (ref 30.0–36.0)
MCV: 95.8 fL (ref 80.0–100.0)
Platelets: 162 10*3/uL (ref 150–400)
RBC: 2.83 MIL/uL — ABNORMAL LOW (ref 3.87–5.11)
RDW: 13.8 % (ref 11.5–15.5)
WBC: 13.1 10*3/uL — ABNORMAL HIGH (ref 4.0–10.5)
nRBC: 0 % (ref 0.0–0.2)

## 2020-06-24 NOTE — Lactation Note (Signed)
This note was copied from a baby's chart. Lactation Consultation Note  Patient Name: Denise Fleming [id number] Date: 06/24/2020 Reason for consult: Initial assessment;Term;1st time breastfeeding Age:26 hours  Per mom, she is interested in apply for the [facility] Program she receives Medicaid, [facility] gave mom WIC's phone number to make an appointment.  P5, term female infant had one void and 4 stools. Mom was concern she did not have any colostrum, LC discussed hand expression and mom expressed 2 mls of colostrum that was spoon feed to infant. Infant been BF 30 minutes most feeding or longer. Mom latched infant on her left breast , infant latched with depth and BF for 10 minutes, LC discussed breast stimulation techniques to keep infant awake while nursing.  Mom knows infant will breast feed according to cues, 8 to 12 or more times within 24 hours, STS. Mom knows to call RN or LC if she has any questions, concerns or need assistance with latching infant at the breast. Mom made aware of O/P services, breastfeeding support groups, community resources, and our phone # for post-discharge questions.  Maternal Data Has patient been taught Hand Expression?: Yes Does the patient have breastfeeding experience prior to this delivery?: Yes How long did the patient breastfeed?: Per mom, she BF all other 4 children in [place] for 14 months each.  Feeding Mother's Current Feeding Choice: Breast Milk  LATCH Score Latch: Grasps breast easily, tongue down, lips flanged, rhythmical sucking.  Audible Swallowing: Spontaneous and intermittent  Type of Nipple: Everted at rest and after stimulation  Comfort (Breast/Nipple): Soft / non-tender  Hold (Positioning): Assistance needed to correctly position infant at breast and maintain latch.  LATCH Score: 9   Lactation Tools Discussed/Used    Interventions Interventions: Assisted with latch;Skin to skin;Breast feeding basics reviewed;Hand express;Breast  compression;Adjust position;Support pillows;Position options;Expressed milk;Education  Discharge Pump: Manual WIC Program: No  Consult Status Consult Status: Follow-up Date: 06/24/20 Follow-up type: In-patient    Danelle Earthly 06/24/2020, 1:31 AM

## 2020-06-24 NOTE — Lactation Note (Signed)
This note was copied from a baby's chart. Lactation Consultation Note  Patient Name: Denise Fleming XIPJA'S Date: 06/24/2020   Age:26 hours  Infant asleep on arrival.  Slight increase in bili.  Mom reports she is worried about her infants weight loss and that she has no milk.  Able to hand express easily.   Assisted mom in waking rousing infant.  Mom also concerned with tongue tie she reports.  Mom reports she discussed with doctor who told her to have lactation look at it.   Mom reports nipples sore but intact.  Upon observation based on doctors report infant does have a tight anterior frenulum.  Assisted mom with latching in cradle hold.  Infant comes off and on.  Able to hand express easily.  After less than 5 minutes she fell asleep.  Urged mom to try and feed her STS, take her cloths off and see if she would feed better/longer. Mom reports other children's tongues were clipped in hospital in [place].Discussed possibility of using DEBP to help initiate supply.    Mom reports she will think about it.  Let her know LC would follow up with her this pm.    Maternal Data    Feeding    LATCH Score                    Lactation Tools Discussed/Used    Interventions    Discharge    Consult Status      Denise Fleming 06/24/2020, 6:32 PM

## 2020-06-24 NOTE — Progress Notes (Signed)
Pt removed pressure dressing and honeycomb after shower. Incision has no bleeding, steristrips intact. New honeycomb dressing applied. Dr. Shawnie Pons made aware.  Tylene Fantasia, RN

## 2020-06-24 NOTE — Progress Notes (Signed)
Subjective: Postpartum Day 1: Cesarean Delivery Patient reports tolerating PO, + flatus and no problems voiding.    Objective: Vital signs in last 24 hours: Temp:  [97.4 F (36.3 C)-98.3 F (36.8 C)] 98.3 F (36.8 C) (03/12 0437) Pulse Rate:  [72-101] 79 (03/12 0437) Resp:  [16-19] 18 (03/12 0437) BP: (93-113)/(47-71) 95/51 (03/12 0437) SpO2:  [90 %-100 %] 100 % (03/12 0437) Weight:  [102 kg] 102 kg (03/11 1035)  Physical Exam:  General: alert, cooperative and no distress Lochia: appropriate Uterine Fundus: firm Incision: no significant drainage DVT Evaluation: No evidence of DVT seen on physical exam.  Recent Labs    06/23/20 1617 06/24/20 0449  HGB 11.1* 9.2*  HCT 32.7* 27.1*    Assessment/Plan: Status post Cesarean section. Doing well postoperatively.  Continue current care.  Sharen Counter 06/24/2020, 8:51 AM

## 2020-06-25 MED ORDER — IBUPROFEN 800 MG PO TABS: 800.0000 mg | ORAL_TABLET | Freq: Four times a day (QID) | ORAL | 0 refills | Status: AC | PRN

## 2020-06-25 NOTE — Lactation Note (Addendum)
This note was copied from a baby's chart. Lactation Consultation Note  Patient Name: Denise Fleming [id number] Date: 06/25/2020 Reason for consult: Follow-up assessment;Term;Infant weight loss;Other (Comment) (7 % weight loss) Age:26 hours , Bili- check 11.3 - High intermediate  Per mom the baby recently fed at 850 am for 10 mins. And the feeding was comfortable with swallows.  Mom showed LC on her left nipple, intact blister - appeared to reabsorbed.  LC encourage to use her EBM liberally on there nipples. Also reviewed breast massage, hand express, reverse pressure.  LC encouraged STS feedings until the baby is back to birth weight and gaining steadily.  Voids and stools correlate with the 7 % weight loss.  LC provided the [facility] pamphlet with resource numbers.   Maternal Data Has patient been taught Hand Expression?: Yes (mom able to hand express easily , colostrum flow noted)  Feeding Mother's Current Feeding Choice: Breast Milk  LATCH Score                    Lactation Tools Discussed/Used Tools: Pump;Flanges Flange Size: 24;27;30 Breast pump type: Manual Pump Education: Setup, frequency, and cleaning;Milk Storage Reason for Pumping: PRN  Interventions Interventions: Breast feeding basics reviewed;Hand pump;Education;Hand express  Discharge Discharge Education: Engorgement and breast care;Warning signs for feeding baby  Consult Status Consult Status: Complete Date: 06/25/20    Kathrin Greathouse 06/25/2020, 9:28 AM

## 2020-06-28 ENCOUNTER — Telehealth: Payer: Self-pay

## 2020-06-28 ENCOUNTER — Other Ambulatory Visit: Payer: Self-pay | Admitting: Obstetrics & Gynecology

## 2020-06-28 DIAGNOSIS — Z98891 History of uterine scar from previous surgery: Secondary | ICD-10-CM

## 2020-06-28 MED ORDER — OXYCODONE-ACETAMINOPHEN 5-325 MG PO TABS
1.0000 | ORAL_TABLET | Freq: Four times a day (QID) | ORAL | 0 refills | Status: DC | PRN
Start: 2020-06-28 — End: 2021-09-05

## 2020-06-28 MED ORDER — OXYCODONE-ACETAMINOPHEN 5-325 MG PO TABS: 1.0000 | ORAL_TABLET | Freq: Four times a day (QID) | ORAL | 0 refills | Status: DC | PRN

## 2020-06-28 NOTE — Telephone Encounter (Signed)
Pt called requesting a stronger medication for pain. She is s/p low transverse c/s on 06/23/20 c/o pain 10/10 at the incision site. She was given IB 800 mg at discharge but it is not easing her pain. Incision check is scheduled on Friday.  Informed pt I will consult with provider and call her back.  Consulted with MD, MD sent rx to pharmacy.

## 2020-06-28 NOTE — Progress Notes (Signed)
Meds ordered this encounter  Medications  . DISCONTD: oxyCODONE-acetaminophen (PERCOCET/ROXICET) 5-325 MG tablet    Sig: Take 1-2 tablets by mouth every 6 (six) hours as needed.    Dispense:  20 tablet    Refill:  0  . oxyCODONE-acetaminophen (PERCOCET/ROXICET) 5-325 MG tablet    Sig: Take 1-2 tablets by mouth every 6 (six) hours as needed.    Dispense:  20 tablet    Refill:  0

## 2020-06-30 ENCOUNTER — Ambulatory Visit: Payer: Medicaid Other

## 2020-06-30 DIAGNOSIS — R309 Painful micturition, unspecified: Secondary | ICD-10-CM

## 2020-06-30 NOTE — Progress Notes (Signed)
Pt is in the office for pp incision check, c-section on 06-23-20. Honey comb removed, incision is intact with no signs of infection. Advised pt to keep incision clean and dry, pt agreed.  Pt reports pelvic pain with urination, advised to leave urine sample today.  PP visit scheduled for 07-27-20

## 2020-07-03 LAB — URINE CULTURE

## 2020-07-27 ENCOUNTER — Ambulatory Visit (INDEPENDENT_AMBULATORY_CARE_PROVIDER_SITE_OTHER): Payer: Medicaid Other | Admitting: Obstetrics and Gynecology

## 2020-07-27 ENCOUNTER — Other Ambulatory Visit: Payer: Self-pay

## 2020-07-27 ENCOUNTER — Encounter: Payer: Self-pay | Admitting: Obstetrics and Gynecology

## 2020-07-27 DIAGNOSIS — Z30431 Encounter for routine checking of intrauterine contraceptive device: Secondary | ICD-10-CM

## 2020-07-27 DIAGNOSIS — R87613 High grade squamous intraepithelial lesion on cytologic smear of cervix (HGSIL): Secondary | ICD-10-CM

## 2020-07-27 DIAGNOSIS — Z9189 Other specified personal risk factors, not elsewhere classified: Secondary | ICD-10-CM

## 2020-07-27 DIAGNOSIS — Z98891 History of uterine scar from previous surgery: Secondary | ICD-10-CM

## 2020-07-27 NOTE — Progress Notes (Signed)
Obstetrics/Postpartum Visit  Appointment Date: 07/27/2020  OBGYN Clinic: [facility]  Primary Care Provider: Patient, [name]  Chief Complaint:  Chief Complaint  Patient presents with  . Postpartum Care    History of Present Illness: Denise Fleming is a 25 y.o. African-American [id number] (No LMP recorded.), seen for the above chief complaint. Her past medical history is significant for prior c-section.   She is s/p RCS on 06/23/20 at 39 weeks; she was discharged to home on POD#2. Pregnancy complicated by HSIL pap with colpo.  Complains of some separation at her incision but has been keeping it dry and it is improved. Overall, feeling well.  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   Her 12 point review of systems is negative or as noted in the History of Present Illness.  Patient Active Problem List   Diagnosis Date Noted  . Status post cesarean delivery 06/23/2020  . Supervision of normal pregnancy 06/23/2020  . Encounter for insertion of intrauterine contraceptive device (IUD)   . [redacted] weeks gestation of pregnancy 06/21/2020  . GBS (group B Streptococcus carrier), +RV culture, currently pregnant 06/12/2020  . Hemorrhoids 05/31/2020  . History of shoulder dystocia in prior pregnancy 05/26/2020  . HSIL (high grade squamous intraepithelial lesion) on Pap smear of cervix 05/26/2020  . Pelvic pain in pregnancy 05/26/2020  . Uterine size-date discrepancy in third trimester 05/26/2020  . Constipation during pregnancy in third trimester 05/26/2020  . Obesity in pregnancy, antepartum 03/22/2020  .  Thyroid enlargement 03/22/2020  . History of C-section 03/22/2020  . Red blood cell antibody positive 03/18/2020  . Supervision of other normal pregnancy, antepartum 02/28/2020  . Late prenatal care affecting pregnancy, antepartum 02/28/2020    Medications Xareni E. Seki had no medications administered during this visit. Current Outpatient Medications  Medication Sig Dispense Refill  . Prenatal 27-1 MG TABS Take 1 tablet by mouth daily. 30 tablet 12  . docusate sodium (COLACE) 100 MG capsule Take 1 capsule (100 mg total) by mouth 2 (two) times daily. (Patient not taking: No sig reported) 60 capsule 1  . ibuprofen (ADVIL) 800 MG tablet Take 1 tablet (800 mg total) by mouth every 6 (six) hours as needed. 30 tablet 0  . oxyCODONE-acetaminophen (PERCOCET/ROXICET) 5-325 MG tablet Take 1-2 tablets by mouth every 6 (six) hours as needed. 20 tablet 0   No current facility-administered medications for this visit.    Allergies Patient has no known allergies.  Physical Exam:  Wt 209 lb (94.8 kg)   BMI 38.23 kg/m  Body mass index is 38.23 kg/m.  Wt 209 lb (94.8 kg)   BMI 38.23 kg/m   General appearance: Well nourished, well developed female in no acute distress.  Cardiovascular: regular rate and rhythm Respiratory:  Normal respiratory effort Abdomen: no masses, hernias; diffusely non tender to palpation, non distended, well healed pfannenstiel incision with 2 mm area of separation at left aspect, well approximated Breasts: not examined. Neuro/Psych:  Normal mood and affect.  Skin:  Warm and dry.   Pelvic exam: is not limited by body habitus EGBUS: within normal limits Vagina: within normal limits and with None  blood in the vault, Cervix:  no lesions or cervical motion tenderness and IUD strings protruding from os  PP Depression Screening:    Edinburgh Postnatal Depression Scale - 07/27/20 1053      Edinburgh Postnatal Depression Scale:  In the Past 7 Days   I have been able to laugh  and see the funny side of things. 0    I have looked forward with enjoyment to things. 0    I have blamed myself unnecessarily when things went wrong. 0    I have been anxious or worried for no good reason. 0    I have felt scared or panicky for no good reason. 0    Things have been getting on top of me. 0    I have been so unhappy that I have had difficulty sleeping. 0    I have felt sad or miserable. 0    I have been so unhappy that I have been crying. 0    The thought of harming myself has occurred to me. 0    Edinburgh Postnatal Depression Scale Total 0           Assessment: Patient is a 25 y.o. [id number] who is 4 weeks post partum from a repeat c-section and IUD placement. She is doing well except she feels baby is not getting enough milk, agreeable to lactation consult.   Plan:   1. Postpartum state Doing well  2. History of C-section Incision healing well  3. HSIL (high grade squamous intraepithelial lesion) on Pap smear of cervix Return 2-4 weeks for colpo  4. IUD check up Strings visualized - POCT urine pregnancy  5. Breastfeeding problem Thinks baby is not getting enough milk - agreeable to referral to lactation - Ambulatory referral to Lactation   Essential components of care per ACOG recommendations:  1.  Mood and well being: Patient with negative depression screening today. Reviewed local resources for support.  - Patient does not use tobacco.  - hx of drug use? No    2. Infant care and feeding:  -Patient currently breastmilk feeding? Yes Having some difficulty with baby getting enough and empyting breasts, offered referral to lactation, she accepts -Social determinants of health (SDOH) reviewed in EPIC. No concerns  3. Sexuality, contraception and birth spacing - Patient does not want a pregnancy in the next year.  Desired family size is unknown children.  - Reviewed forms of contraception in tiered fashion. Patient desired IUD, plcaed at the time of  c-section.   - Discussed birth spacing of 18 months  4. Sleep and fatigue -Encouraged family/partner/community support of 4 hrs of uninterrupted sleep to help with mood and fatigue  5. Physical Recovery  - Discussed patients delivery and complications - Patient had a no laceration, perineal healing reviewed. Patient expressed understanding - Patient has urinary incontinence? No  - Patient is safe to resume physical and sexual activity  6.  Health Maintenance - Last pap smear done 12/21 and was abnormal with HSIL, colpo done in pregnancy - will return 2-4 weeks for repeat colpo   RTC 2-4 weeks for colpo   Baldemar Lenis, MD, [facility] for [facility])

## 2020-07-27 NOTE — Progress Notes (Signed)
Pt is in office for PP visit. Pt had repeat C/S on 06/23/20. Pt states she has a area of concern at her incision site.  Pt is breast feeding. Pt states unsure if baby is getting enough milk as she cries all the time.  Pt had IUD placed at time of C/S. Pt is not sexually active.  Pt has no urinary concerns or constipation.  Depression score 0 today.

## 2020-09-01 ENCOUNTER — Other Ambulatory Visit: Payer: Self-pay

## 2020-09-01 ENCOUNTER — Other Ambulatory Visit (HOSPITAL_COMMUNITY)
Admission: RE | Admit: 2020-09-01 | Discharge: 2020-09-01 | Disposition: A | Discharge status: Home / Self Care | Payer: Medicaid Other | Source: Ambulatory Visit | Attending: Obstetrics and Gynecology | Admitting: Obstetrics and Gynecology

## 2020-09-01 ENCOUNTER — Ambulatory Visit (INDEPENDENT_AMBULATORY_CARE_PROVIDER_SITE_OTHER): Payer: Medicaid Other | Admitting: Obstetrics and Gynecology

## 2020-09-01 ENCOUNTER — Encounter: Payer: Self-pay | Admitting: Obstetrics and Gynecology

## 2020-09-01 VITALS — BP 106/75 | HR 77 | Wt 213.0 lb

## 2020-09-01 DIAGNOSIS — Z01812 Encounter for preprocedural laboratory examination: Secondary | ICD-10-CM

## 2020-09-01 DIAGNOSIS — R87613 High grade squamous intraepithelial lesion on cytologic smear of cervix (HGSIL): Secondary | ICD-10-CM | POA: Insufficient documentation

## 2020-09-01 DIAGNOSIS — R21 Rash and other nonspecific skin eruption: Secondary | ICD-10-CM

## 2020-09-01 LAB — POCT URINE PREGNANCY: Preg Test, Ur: NEGATIVE

## 2020-09-01 NOTE — Progress Notes (Signed)
Patient given informed consent, signed copy in the chart, time out was performed.  Placed in lithotomy position. UPT neg.  Cervix viewed with speculum and colposcope after application of acetic acid.   Colposcopy adequate?  yes Acetowhite lesions? Yes, from 2-5 o clock, moderately dense Punctation? no Mosaicism?  no Abnormal vasculature?  no Biopsies? Yes at 5 oclock ECC? no  COMMENTS:  Patient was given post procedure instructions.  Future plan dependent on path results.  Pelvic rest 2 weeks.  Pt also c/o nonspecific skin rash with multiple raised lesions, will refer to derm  Warden Fillers, MD

## 2020-09-01 NOTE — Patient Instructions (Signed)
Cervical Dysplasia  Cervical dysplasia is a condition in which the cells in a woman's cervix have abnormal changes. The cervix is the opening of the uterus. It is located between the vagina and the uterus. Cervical dysplasia may be an early sign of cervical cancer. If left untreated, this condition may become more severe and may progress to cervical cancer. Early detection, treatment, and follow-up care are very important. What are the causes? Cervical dysplasia is usually caused by a human papillomavirus (HPV) infection. HPV is spread from person to person through sexual contact. This includes oral, vaginal, or anal sex. HPV is the most common sexually transmitted infection (STI). You are more likely to be exposed to HPV through sexual contact if:  You have had more than one sexual partner or you have a sexual partner who has multiple sexual partners.  You do not use a condom during sex, especially with new sexual partners. What increases the risk? The following factors may make you more likely to develop this condition:  Having a family history of cervical cancer or a personal history of cancer of the vagina or vulva.  Having had an STI, such as herpes, chlamydia, or gonorrhea.  Becoming sexually active before age 18.  Having a weakened disease-fighting system (immunesystem).  Smoking.  Being the daughter of a woman who took diethylstilbestrol (DES), a synthetic estrogen, during pregnancy. What are the signs or symptoms? There are usually no symptoms of this condition. If you do have symptoms, they may include:  Abnormal vaginal discharge.  Bleeding between periods or after sex.  Bleeding during menopause.  Pain during sex. How is this diagnosed? This condition may be diagnosed with a Pap test. During this test, cells are swabbed from the cervix and checked under a microscope. If the Pap test is abnormal or if the cervix looks abnormal, you may also have a test in which a  tissue sample is removed from the cervix and looked at under a microscope(biopsy). How is this treated? Treatment varies based on the severity of the condition. Treatment may include:  Cryotherapy. During this therapy, the abnormal cells are frozen with a steel-tipped instrument.  Loop electrosurgical excision procedure (LEEP). LEEP removes abnormal tissue from the cervix.  Surgery to remove abnormal tissue. This is usually done in more severe cases. Options include: ? A cone biopsy. This treatment removes the cervical canal and part of the center of the cervix. ? Hysterectomy. This is a surgery in which the uterus and cervix are removed. Follow these instructions at home:  Take over-the-counter and prescription medicines only as told by your health care provider.  Do not use tampons, have sex, or douche until your health care provider says it is safe.  Keep all follow-up visits. This is important. Women who have been treated for cervical dysplasia should have regular pelvic exams and Pap tests. How is this prevented?  Practice safe sex to help prevent STIs.  Have regular Pap tests. Talk with your health care provider about how often you need these tests. Pap tests will help identify cell changes that can lead to cancer.  Ask your health care provider about possible vaccines to protect yourself against HPV. Contact a health care provider if:  You develop genital warts. The risk of cervical cancer is higher with certain types of HPV.  Your menstrual period is heavier than normal or you develop bright red bleeding, which may include blood clots.  You have abnormal vaginal discharge.  You have a fever.   Get help right away if:  You have pain or cramps in the abdomen that get worse, and medicine does not help to relieve your pain.  You feel light-headed and are unusually weak, or you faint. Summary  Cervical dysplasia is a condition in which a woman's cervix cells have abnormal  changes.  If left untreated, this condition may become more severe and may progress to cervical cancer.  Early detection, treatment, and follow-up care are very important in managing this condition.  Have regular pelvic exams and Pap tests. Talk with your health care provider about how often you need these tests. Pap tests will help identify cell changes that can lead to cancer. This information is not intended to replace advice given to you by your health care provider. Make sure you discuss any questions you have with your health care provider. Document Revised: 10/08/2019 Document Reviewed: 10/08/2019 Elsevier Patient Education  2021 Elsevier Inc. Colposcopy, Care After This sheet gives you information about how to care for yourself after your procedure. Your doctor may also give you more specific instructions. If you have problems or questions, contact your doctor. What can I expect after the procedure? If you did not have a sample of your tissue taken out (did not have a biopsy), you may only have some spotting of blood for a few days. You can go back to your normal activities. If you had a sample of your tissue taken out, it is common to have:  Soreness and mild pain. These may last for a few days.  A light-headed feeling.  Mild bleeding or fluid (discharge) coming from your vagina. The fluid will look dark and grainy. You may have this for a few days. The fluid may be caused by a liquid that was used during your procedure. You may need to wear a sanitary pad.  Spotting of blood for at least 48 hours after the procedure. Follow these instructions at home: Medicines  Take over-the-counter and prescription medicines only as told by your doctor.  Ask your doctor what medicines you can start taking again. This is very important if you take blood thinners. Activity  Limit your activity for the first day after your procedure as told by your doctor.  For at least 3 days, or for as  long as told by your doctor, avoid: ? Douching. ? Using tampons. ? Having sex.  Return to your normal activities as told by your doctor. Ask your doctor what activities are safe for you. General instructions  Drink enough fluid to keep your pee (urine) pale yellow.  Ask your doctor if you may take baths, swim, or use a hot tub. You may take showers.  If you use birth control (contraception), keep using it.  Keep all follow-up visits as told by your doctor. This is important.   Contact a doctor if:  You get a skin rash. Get help right away if:  You bleed a lot from your vagina. A lot of bleeding means you use more than one pad an hour for 2 hours in a row.  You have clumps of blood (blood clots) coming from your vagina.  You have a fever or chills.  You have signs of infection. This may be fluid coming from your vagina that is: ? Different than normal. ? Yellow. ? Bad-smelling.  You have very bad pain or cramps in your lower belly that do not get better with medicine.  You faint. Summary  If you did not have a  sample of your tissue taken out, you may only have some spotting of blood for a few days. You can go back to your normal activities.  If you had a sample of your tissue taken out, it is common to have mild pain for a few days and spotting for 48 hours.  Avoid douching, using tampons, and having sex for at least 3 days after the procedure or for as long as told.  Get help right away if you have a lot of bleeding, very bad pain, or signs of infection. This information is not intended to replace advice given to you by your health care provider. Make sure you discuss any questions you have with your health care provider. Document Revised: 02/01/2020 Document Reviewed: 03/31/2019 Elsevier Patient Education  2021 Elsevier Inc.  

## 2020-09-01 NOTE — Progress Notes (Signed)
GYN presents for COLPO for HSIL on PAP.  UPT today is NEGATIVE

## 2020-09-04 LAB — SURGICAL PATHOLOGY

## 2020-09-06 ENCOUNTER — Telehealth: Payer: Self-pay

## 2020-09-06 NOTE — Telephone Encounter (Signed)
Pt made aware of results and voiced understanding Pt info given to scheduling for LEEP w/Dr.Bass.

## 2020-09-06 NOTE — Telephone Encounter (Signed)
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

## 2020-10-04 ENCOUNTER — Encounter: Payer: Medicaid Other | Admitting: Obstetrics and Gynecology

## 2021-02-19 ENCOUNTER — Emergency Department (HOSPITAL_COMMUNITY)
Admission: EM | Admit: 2021-02-19 | Discharge: 2021-02-19 | Disposition: A | Discharge status: Home / Self Care | Payer: Medicaid Other | Attending: Emergency Medicine | Admitting: Emergency Medicine

## 2021-02-19 ENCOUNTER — Emergency Department (HOSPITAL_COMMUNITY): Payer: Medicaid Other

## 2021-02-19 DIAGNOSIS — R059 Cough, unspecified: Secondary | ICD-10-CM | POA: Diagnosis present

## 2021-02-19 DIAGNOSIS — Z20822 Contact with and (suspected) exposure to covid-19: Secondary | ICD-10-CM | POA: Insufficient documentation

## 2021-02-19 DIAGNOSIS — R1110 Vomiting, unspecified: Secondary | ICD-10-CM

## 2021-02-19 DIAGNOSIS — K910 Vomiting following gastrointestinal surgery: Secondary | ICD-10-CM | POA: Diagnosis not present

## 2021-02-19 DIAGNOSIS — J101 Influenza due to other identified influenza virus with other respiratory manifestations: Secondary | ICD-10-CM | POA: Diagnosis not present

## 2021-02-19 DIAGNOSIS — R791 Abnormal coagulation profile: Secondary | ICD-10-CM | POA: Insufficient documentation

## 2021-02-19 LAB — CBC WITH DIFFERENTIAL/PLATELET
Abs Immature Granulocytes: 0.02 10*3/uL (ref 0.00–0.07)
Basophils Absolute: 0 10*3/uL (ref 0.0–0.1)
Basophils Relative: 1 %
Eosinophils Absolute: 0.4 10*3/uL (ref 0.0–0.5)
Eosinophils Relative: 7 %
HCT: 40.1 % (ref 36.0–46.0)
Hemoglobin: 13.5 g/dL (ref 12.0–15.0)
Immature Granulocytes: 0 %
Lymphocytes Relative: 24 %
Lymphs Abs: 1.3 10*3/uL (ref 0.7–4.0)
MCH: 32.2 pg (ref 26.0–34.0)
MCHC: 33.7 g/dL (ref 30.0–36.0)
MCV: 95.7 fL (ref 80.0–100.0)
Monocytes Absolute: 1 10*3/uL (ref 0.1–1.0)
Monocytes Relative: 18 %
Neutro Abs: 2.8 10*3/uL (ref 1.7–7.7)
Neutrophils Relative %: 50 %
Platelets: 172 10*3/uL (ref 150–400)
RBC: 4.19 MIL/uL (ref 3.87–5.11)
RDW: 12.7 % (ref 11.5–15.5)
WBC: 5.6 10*3/uL (ref 4.0–10.5)
nRBC: 0 % (ref 0.0–0.2)

## 2021-02-19 LAB — RESP PANEL BY RT-PCR (FLU A&B, COVID) ARPGX2
Influenza A by PCR: POSITIVE — AB
Influenza B by PCR: NEGATIVE
SARS Coronavirus 2 by RT PCR: NEGATIVE

## 2021-02-19 LAB — URINALYSIS, ROUTINE W REFLEX MICROSCOPIC
Bilirubin Urine: NEGATIVE
Glucose, UA: NEGATIVE mg/dL
Ketones, ur: NEGATIVE mg/dL
Nitrite: NEGATIVE
Protein, ur: NEGATIVE mg/dL
Specific Gravity, Urine: 1.024 (ref 1.005–1.030)
pH: 6 (ref 5.0–8.0)

## 2021-02-19 LAB — COMPREHENSIVE METABOLIC PANEL
ALT: 16 U/L (ref 0–44)
AST: 20 U/L (ref 15–41)
Albumin: 3.5 g/dL (ref 3.5–5.0)
Alkaline Phosphatase: 79 U/L (ref 38–126)
Anion gap: 9 (ref 5–15)
BUN: 9 mg/dL (ref 6–20)
CO2: 23 mmol/L (ref 22–32)
Calcium: 9 mg/dL (ref 8.9–10.3)
Chloride: 106 mmol/L (ref 98–111)
Creatinine, Ser: 0.81 mg/dL (ref 0.44–1.00)
GFR, Estimated: >60 mL/min (ref >60)
Glucose, Bld: 97 mg/dL (ref 70–99)
Potassium: 4.2 mmol/L (ref 3.5–5.1)
Sodium: 138 mmol/L (ref 135–145)
Total Bilirubin: 0.6 mg/dL (ref 0.3–1.2)
Total Protein: 7.6 g/dL (ref 6.5–8.1)

## 2021-02-19 LAB — PROTIME-INR
INR: 1.1 (ref 0.8–1.2)
Prothrombin Time: 14 seconds (ref 11.4–15.2)

## 2021-02-19 LAB — LACTIC ACID, PLASMA: Lactic Acid, Venous: 1 mmol/L (ref 0.5–1.9)

## 2021-02-19 LAB — GROUP A STREP BY PCR: Group A Strep by PCR: NOT DETECTED

## 2021-02-19 MED ORDER — ONDANSETRON HCL 4 MG/2ML IJ SOLN
4.0000 mg | Freq: Once | INTRAMUSCULAR | Status: AC
  Administered 2021-02-19: 4 mg via INTRAVENOUS
  Filled 2021-02-19: qty 2

## 2021-02-19 MED ORDER — KETOROLAC TROMETHAMINE 30 MG/ML IJ SOLN
30.0000 mg | Freq: Once | INTRAMUSCULAR | Status: AC
  Administered 2021-02-19: 30 mg via INTRAVENOUS
  Filled 2021-02-19: qty 1

## 2021-02-19 MED ORDER — BENZONATATE 200 MG PO CAPS: 200.0000 mg | ORAL_CAPSULE | Freq: Three times a day (TID) | ORAL | 0 refills | Status: DC

## 2021-02-19 MED ORDER — SODIUM CHLORIDE 0.9 % IV BOLUS
1000.0000 mL | Freq: Once | INTRAVENOUS | Status: AC
  Administered 2021-02-19: 1000 mL via INTRAVENOUS

## 2021-02-19 MED ORDER — ACETAMINOPHEN 325 MG PO TABS
650.0000 mg | ORAL_TABLET | Freq: Once | ORAL | Status: AC
  Administered 2021-02-19: 650 mg via ORAL
  Filled 2021-02-19: qty 2

## 2021-02-19 MED ORDER — ONDANSETRON 4 MG PO TBDP: ORAL_TABLET | ORAL | 0 refills | Status: DC

## 2021-02-19 MED ORDER — BENZONATATE 100 MG PO CAPS
200.0000 mg | ORAL_CAPSULE | Freq: Once | ORAL | Status: AC
  Administered 2021-02-19: 200 mg via ORAL
  Filled 2021-02-19: qty 2

## 2021-02-19 NOTE — ED Triage Notes (Signed)
Pt. Stated, Denise Fleming had a cough, throat hurts, and fever for 3 days.

## 2021-02-19 NOTE — ED Provider Notes (Signed)
[facility] EMERGENCY DEPARTMENT Provider Note   CSN: [id number] Arrival date & time: 02/19/21  0730     History Chief Complaint  Patient presents with   Cough   Sore Throat   Fever    Denise Fleming is a 25 y.o. female.  Denise Fleming is a 25 y.o. female who is otherwise healthy, presents to the emergency department for evaluation of cough, sore throat and fever.  Symptoms have been present for the past 3 days.  Patient is unsure of sick contacts.  Fever of 102.2 on arrival.  She has been using NyQuil and DayQuil with minimal improvement in her symptoms.  Reports cough is occasionally productive, and reports that when she gets into a coughing spell it results in  posttussive emesis, outside of coughing she is usually able to keep things down.  Reports she has had some occasional diarrhea.  Reports generalized body aches and headache.  Chest pain only present with cough.  No shortness of breath.  No other aggravating or alleviating factors  The history is provided by the patient and medical records.      Past Medical History:  Diagnosis Date   Medical history non-contributory     Patient Active Problem List   Diagnosis Date Noted   Rash and nonspecific skin eruption 09/01/2020   Status post cesarean delivery 06/23/2020   Supervision of normal pregnancy 06/23/2020   Encounter for insertion of intrauterine contraceptive device (IUD)    [redacted] weeks gestation of pregnancy 06/21/2020   GBS (group B Streptococcus carrier), +RV culture, currently pregnant 06/12/2020   Hemorrhoids 05/31/2020   History of shoulder dystocia in prior pregnancy 05/26/2020   HSIL (high grade squamous intraepithelial lesion) on Pap smear of cervix 05/26/2020   Pelvic pain in pregnancy 05/26/2020   Uterine size-date discrepancy in third trimester 05/26/2020   Constipation during pregnancy in third trimester 05/26/2020   Obesity in pregnancy, antepartum 03/22/2020   Thyroid  enlargement 03/22/2020   History of C-section 03/22/2020   Red blood cell antibody positive 03/18/2020   Supervision of other normal pregnancy, antepartum 02/28/2020   Late prenatal care affecting pregnancy, antepartum 02/28/2020    Past Surgical History:  Procedure Laterality Date   CESAREAN SECTION     CESAREAN SECTION N/A 06/23/2020   Procedure: CESAREAN SECTION;  Surgeon: Reva Bores, MD;  Location: [facility] LD ORS;  Service: Obstetrics;  Laterality: N/A;     OB History     Gravida  5   Para  5   Term  4   Preterm  1   AB  0   Living  5      SAB      IAB      Ectopic      Multiple  0   Live Births  5        Obstetric Comments  1st baby "premature"-[place], [place] 3rd baby C/S-[place]          Family History  Problem Relation Age of Onset   Healthy Mother     Social History   Tobacco Use   Smoking status: Never   Smokeless tobacco: Never  Vaping Use   Vaping Use: Never used  Substance Use Topics   Alcohol use: Never   Drug use: Never    Home Medications Prior to Admission medications   Medication Sig Start Date End Date Taking? Authorizing Provider  benzonatate (TESSALON) 200 MG capsule Take 1 capsule (200 mg total) by  mouth every 8 (eight) hours. 02/19/21  Yes Dartha Lodge, PA-C  ondansetron (ZOFRAN ODT) 4 MG disintegrating tablet 4mg  ODT q4 hours prn nausea/vomit 02/19/21  Yes Adriaan Maltese, 13/7/22, PA-C  docusate sodium (COLACE) 100 MG capsule Take 1 capsule (100 mg total) by mouth 2 (two) times daily. Patient not taking: No sig reported 05/26/20   Nugent, 07/24/20, NP  ibuprofen (ADVIL) 800 MG tablet Take 1 tablet (800 mg total) by mouth every 6 (six) hours as needed. 06/25/20   06/27/20, CNM  oxyCODONE-acetaminophen (PERCOCET/ROXICET) 5-325 MG tablet Take 1-2 tablets by mouth every 6 (six) hours as needed. 06/28/20   06/30/20, MD  Prenatal 27-1 MG TABS Take 1 tablet by mouth daily. 04/11/20   04/13/20, PA-C     Allergies    Patient has no known allergies.  Review of Systems   Review of Systems  Constitutional:  Positive for chills and fever.  HENT:  Positive for congestion, rhinorrhea and sore throat.   Respiratory:  Positive for cough. Negative for shortness of breath.   Cardiovascular:  Negative for chest pain.  Gastrointestinal:  Positive for diarrhea, nausea and vomiting. Negative for abdominal pain.  Genitourinary:  Negative for dysuria.  Musculoskeletal:  Positive for myalgias. Negative for arthralgias, neck pain and neck stiffness.  Skin:  Negative for rash.  Neurological:  Positive for headaches. Negative for syncope and light-headedness.  All other systems reviewed and are negative.  Physical Exam Updated Vital Signs BP 99/76 (BP Location: Left Arm)   Pulse 74   Temp 98.3 F (36.8 C) (Oral)   Resp 16   SpO2 98%   Physical Exam Vitals and nursing note reviewed.  Constitutional:      General: She is not in acute distress.    Appearance: She is well-developed. She is not ill-appearing or diaphoretic.     Comments: Well-appearing and in no distress  HENT:     Head: Normocephalic and atraumatic.     Nose: Rhinorrhea present.     Mouth/Throat:     Mouth: Mucous membranes are moist.     Pharynx: Oropharynx is clear.     Comments: Posterior oropharynx clear and mucous membranes moist, there is mild erythema but no edema or tonsillar exudates, uvula midline, normal phonation, no trismus, tolerating secretions without difficulty. Eyes:     General:        Right eye: No discharge.        Left eye: No discharge.  Neck:     Comments: No rigidity Cardiovascular:     Rate and Rhythm: Normal rate and regular rhythm.     Heart sounds: Normal heart sounds. No murmur heard.   No friction rub. No gallop.  Pulmonary:     Effort: Pulmonary effort is normal. No respiratory distress.     Breath sounds: Normal breath sounds.     Comments: Respirations equal and unlabored, patient  able to speak in full sentences, lungs clear to auscultation bilaterally  Abdominal:     General: Bowel sounds are normal. There is no distension.     Palpations: Abdomen is soft. There is no mass.     Tenderness: There is no abdominal tenderness. There is no guarding.     Comments: Abdomen soft, nondistended, nontender to palpation in all quadrants without guarding or peritoneal signs  Musculoskeletal:        General: No deformity.     Cervical back: Neck supple.  Lymphadenopathy:  Cervical: No cervical adenopathy.  Skin:    General: Skin is warm and dry.     Capillary Refill: Capillary refill takes less than 2 seconds.  Neurological:     Mental Status: She is alert and oriented to person, place, and time.  Psychiatric:        Mood and Affect: Mood normal.        Behavior: Behavior normal.    ED Results / Procedures / Treatments   Labs (all labs ordered are listed, but only abnormal results are displayed) Labs Reviewed  RESP PANEL BY RT-PCR (FLU A&B, COVID) ARPGX2 - Abnormal; Notable for the following components:      Result Value   Influenza A by PCR POSITIVE (*)    All other components within normal limits  URINALYSIS, ROUTINE W REFLEX MICROSCOPIC - Abnormal; Notable for the following components:   APPearance HAZY (*)    Hgb urine dipstick SMALL (*)    Leukocytes,Ua SMALL (*)    Bacteria, UA RARE (*)    All other components within normal limits  GROUP A STREP BY PCR  CULTURE, BLOOD (ROUTINE X 2)  COMPREHENSIVE METABOLIC PANEL  LACTIC ACID, PLASMA  CBC WITH DIFFERENTIAL/PLATELET  PROTIME-INR  I-STAT BETA HCG BLOOD, ED ([facility], [facility], [facility] ONLY)    EKG None  Radiology DG Chest 2 View  Result Date: 02/19/2021 CLINICAL DATA:  Sore throat and fever EXAM: CHEST - 2 VIEW COMPARISON:  Chest x-ray 04/11/2020 FINDINGS: Heart size and mediastinal contours are within normal limits. No suspicious pulmonary opacities identified. No pleural effusion or pneumothorax visualized. No  acute osseous abnormality appreciated. IMPRESSION: No acute intrathoracic process identified. Electronically Signed   By: Jannifer Hick M.D.   On: 02/19/2021 08:32    Procedures Procedures   Medications Ordered in ED Medications  benzonatate (TESSALON) capsule 200 mg (has no administration in time range)  acetaminophen (TYLENOL) tablet 650 mg (650 mg Oral Given 02/19/21 0812)  sodium chloride 0.9 % bolus 1,000 mL (1,000 mLs Intravenous New Bag/Given 02/19/21 1624)  ondansetron (ZOFRAN) injection 4 mg (4 mg Intravenous Given 02/19/21 1624)  ketorolac (TORADOL) 30 MG/ML injection 30 mg (30 mg Intravenous Given 02/19/21 1624)    ED Course  I have reviewed the triage vital signs and the nursing notes.  Pertinent labs & imaging results that were available during my care of the patient were reviewed by me and considered in my medical decision making (see chart for details).    MDM Rules/Calculators/A&P                           Patient with symptoms consistent with influenza.  Febrile and tachycardic on arrival, Improved with medication.  Patient does appear somewhat dehydrated and has been having episodes of vomiting primarily after episodes of cough, Zofran and Tessalon Perles given and patient given liter of IV fluids.  Did have some low blood pressure readings while in the waiting room but patient had on a blood pressure cuff that was much too small, after appropriate cuff was placed on patient's arm blood pressure improved.  Labs were ordered from triage which are all reassuring, no leukocytosis, no electrolyte derangements, normal renal and liver function.  Patient tested positive for influenza..  Lungs are clear. CXR without signs of pneumonia, or other active cardiopulmonary disease. The patient understands that symptoms are greater than the recommended 48 hour window of treatment, no tamiflu provided.  Patient will be discharged with instructions  to orally hydrate, rest, and use  over-the-counter medications such as anti-inflammatories ibuprofen and Aleve for muscle aches and Tylenol for fever.  Patient will also be given a cough suppressant and zofran as well.  Discussed appropriate return precautions and outpatient follow-up.  Work note provided.  Patient expresses understanding and agreement with plan.  Discharged home in good condition.  Final Clinical Impression(s) / ED Diagnoses Final diagnoses:  Influenza A  Post-tussive emesis    Rx / DC Orders ED Discharge Orders          Ordered    ondansetron (ZOFRAN ODT) 4 MG disintegrating tablet        02/19/21 1757    benzonatate (TESSALON) 200 MG capsule  Every 8 hours        02/19/21 1757             Dartha Lodge, PA-C 02/19/21 1811    Terald Sleeper, MD 02/19/21 2144

## 2021-02-19 NOTE — Discharge Instructions (Signed)
You have the flu this is a viral infection that will likely start to improve after 7-10 days, antibiotics are not helpful in treating viral infections. You may use Zofran as needed for nausea. Please make sure you are drinking plenty of fluids. You can treat your symptoms supportively with tylenol 1000mg  and ibuprofen 600 mg every 6 hours for fevers and pains. For nasal congestion you can use Zyrtec and Flonase to help with nasal congestion. To treat cough you can use over prescribed Tessalon Perles and throat lozenges. If your symptoms are not improving please follow up with you Primary doctor.   If you develop persistent fevers, shortness of breath or difficulty breathing, chest pain, severe headache and neck pain, persistent nausea and vomiting or other new or concerning symptoms return to the Emergency department.

## 2021-02-22 ENCOUNTER — Other Ambulatory Visit (HOSPITAL_COMMUNITY)
Admission: RE | Admit: 2021-02-22 | Discharge: 2021-02-22 | Disposition: A | Discharge status: Home / Self Care | Payer: Medicaid Other | Source: Ambulatory Visit | Attending: Obstetrics and Gynecology | Admitting: Obstetrics and Gynecology

## 2021-02-22 ENCOUNTER — Encounter: Payer: Self-pay | Admitting: Obstetrics and Gynecology

## 2021-02-22 ENCOUNTER — Other Ambulatory Visit: Payer: Self-pay

## 2021-02-22 ENCOUNTER — Ambulatory Visit (INDEPENDENT_AMBULATORY_CARE_PROVIDER_SITE_OTHER): Payer: Medicaid Other | Admitting: Obstetrics and Gynecology

## 2021-02-22 DIAGNOSIS — Z975 Presence of (intrauterine) contraceptive device: Secondary | ICD-10-CM | POA: Diagnosis not present

## 2021-02-22 DIAGNOSIS — Z113 Encounter for screening for infections with a predominantly sexual mode of transmission: Secondary | ICD-10-CM | POA: Insufficient documentation

## 2021-02-22 DIAGNOSIS — N871 Moderate cervical dysplasia: Secondary | ICD-10-CM | POA: Insufficient documentation

## 2021-02-22 DIAGNOSIS — Z01812 Encounter for preprocedural laboratory examination: Secondary | ICD-10-CM | POA: Diagnosis not present

## 2021-02-22 LAB — POCT URINE PREGNANCY: Preg Test, Ur: NEGATIVE

## 2021-02-22 NOTE — Progress Notes (Signed)
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 She also wants STD screening.  UPT today is NEGATIVE.

## 2021-02-22 NOTE — Progress Notes (Signed)
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  Pt lost to follow up until now.  IUD strings seen. Placed in lithotomy position. Cervix viewed with speculum and colposcope after application of acetic acid.   Colposcopy adequate?  yes Acetowhite lesions?no, no nonstaining areas with lugol's solution Punctation?no Mosaicism?  no Abnormal vasculature?  no Biopsies? none ECC?none  COMMENTS: Patient was given post procedure instructions.   No acetowhite lesions or precancerous lesions noted.  No nonstaining areas with lugol's, no occult dysplasia noted.    Decision made not to perform LEEP Will repap in 4 months at annual exam   Warden Fillers, MD

## 2021-02-23 LAB — CERVICOVAGINAL ANCILLARY ONLY
Chlamydia: NEGATIVE
Comment: NEGATIVE
Comment: NEGATIVE
Comment: NORMAL
Neisseria Gonorrhea: NEGATIVE
Trichomonas: NEGATIVE

## 2021-02-24 LAB — CULTURE, BLOOD (ROUTINE X 2)
Culture: NO GROWTH
Special Requests: ADEQUATE

## 2021-08-09 ENCOUNTER — Ambulatory Visit: Payer: Medicaid Other | Admitting: Obstetrics and Gynecology

## 2021-09-05 ENCOUNTER — Other Ambulatory Visit (HOSPITAL_COMMUNITY)
Admission: RE | Admit: 2021-09-05 | Discharge: 2021-09-05 | Disposition: A | Discharge status: Home / Self Care | Payer: Medicaid Other | Source: Ambulatory Visit | Attending: Obstetrics and Gynecology | Admitting: Obstetrics and Gynecology

## 2021-09-05 ENCOUNTER — Ambulatory Visit (INDEPENDENT_AMBULATORY_CARE_PROVIDER_SITE_OTHER): Payer: Medicaid Other | Admitting: Obstetrics

## 2021-09-05 ENCOUNTER — Encounter: Payer: Self-pay | Admitting: Obstetrics

## 2021-09-05 VITALS — BP 116/78 | HR 69 | Wt 219.0 lb

## 2021-09-05 DIAGNOSIS — R102 Pelvic and perineal pain: Secondary | ICD-10-CM | POA: Diagnosis not present

## 2021-09-05 DIAGNOSIS — R87613 High grade squamous intraepithelial lesion on cytologic smear of cervix (HGSIL): Secondary | ICD-10-CM | POA: Diagnosis not present

## 2021-09-05 DIAGNOSIS — N898 Other specified noninflammatory disorders of vagina: Secondary | ICD-10-CM | POA: Diagnosis present

## 2021-09-05 DIAGNOSIS — Z01419 Encounter for gynecological examination (general) (routine) without abnormal findings: Secondary | ICD-10-CM | POA: Insufficient documentation

## 2021-09-05 DIAGNOSIS — E669 Obesity, unspecified: Secondary | ICD-10-CM

## 2021-09-05 DIAGNOSIS — L819 Disorder of pigmentation, unspecified: Secondary | ICD-10-CM

## 2021-09-05 LAB — POCT URINE PREGNANCY: Preg Test, Ur: NEGATIVE

## 2021-09-05 MED ORDER — IBUPROFEN 800 MG PO TABS: 800.0000 mg | ORAL_TABLET | Freq: Three times a day (TID) | ORAL | 5 refills | Status: AC | PRN

## 2021-09-05 NOTE — Progress Notes (Signed)
Subjective:        Denise Fleming is a 26 y.o. female here for a routine exam.  Current complaints: Pain and spotting after intercourse.  Vaginal discharge.  Has IUD in place.. Also c/o feeling cold and tired all the time.  Personal health questionnaire:  Is patient Ashkenazi Jewish, have a family history of breast and/or ovarian cancer: no Is there a family history of uterine cancer diagnosed at age < 45, gastrointestinal cancer, urinary tract cancer, family member who is a Personnel officer syndrome-associated carrier: no Is the patient overweight and hypertensive, family history of diabetes, personal history of gestational diabetes, preeclampsia or PCOS: no Is patient over 75, have PCOS,  family history of premature CHD under age 73, diabetes, smoke, have hypertension or peripheral artery disease:  no At any time, has a partner hit, kicked or otherwise hurt or frightened you?: no Over the past 2 weeks, have you felt down, depressed or hopeless?: no Over the past 2 weeks, have you felt little interest or pleasure in doing things?:no   Gynecologic History No LMP recorded. (Menstrual status: IUD). Contraception: IUD Last Pap: 2022. Results were: abnormal Last mammogram: n/a. Results were: n/a  Obstetric History OB History  Gravida Para Term Preterm AB Living  5 5 4 1  0 5  SAB IAB Ectopic Multiple Live Births        0 5    # Outcome Date GA Lbr Len/2nd Weight Sex Delivery Anes PTL Lv  5 Term 06/23/20 [redacted]w[redacted]d  8 lb 8.3 oz (3.865 kg) F CS-LTranv Spinal  LIV  4 Term 08/11/17 [redacted]w[redacted]d  8 lb 6 oz (3.799 kg) F VBAC  N LIV     Complications: Shoulder Dystocia  3 Term 08/22/13   9 lb 11.2 oz (4.4 kg) M CS-LTranv   LIV  2 Term 08/30/09   7 lb 7 oz (3.374 kg) M Vag-Spont   LIV  1 Preterm 09/06/06 [redacted]w[redacted]d   M Vag-Spont   LIV    Obstetric Comments  1st baby "premature"-[place], [redacted]w[redacted]d  3rd baby C/S-[place]    Past Medical History:  Diagnosis Date   Medical history non-contributory      Past Surgical History:  Procedure Laterality Date   CESAREAN SECTION     CESAREAN SECTION N/A 06/23/2020   Procedure: CESAREAN SECTION;  Surgeon: 08/23/2020, MD;  Location: [facility];  Service: Obstetrics;  Laterality: N/A;     Current Outpatient Medications:    ibuprofen (ADVIL) 800 MG tablet, Take 1 tablet (800 mg total) by mouth every 8 (eight) hours as needed., Disp: 30 tablet, Rfl: 5   docusate sodium (COLACE) 100 MG capsule, Take 1 capsule (100 mg total) by mouth 2 (two) times daily. (Patient not taking: No sig reported), Disp: 60 capsule, Rfl: 1   ibuprofen (ADVIL) 800 MG tablet, Take 1 tablet (800 mg total) by mouth every 6 (six) hours as needed., Disp: 30 tablet, Rfl: 0   Prenatal 27-1 MG TABS, Take 1 tablet by mouth daily., Disp: 30 tablet, Rfl: 12 No Known Allergies  Social History   Tobacco Use   Smoking status: Never   Smokeless tobacco: Never  Substance Use Topics   Alcohol use: Never    Family History  Problem Relation Age of Onset   Healthy Mother       Review of Systems  Constitutional: positive for fatigue and feeling cold all the time Respiratory: negative for cough and wheezing Cardiovascular: negative for chest pain, fatigue and  palpitations Gastrointestinal: negative for abdominal pain and change in bowel habits Musculoskeletal:negative for myalgias Neurological: negative for gait problems and tremors Behavioral/Psych: negative for abusive relationship, depression Endocrine: negative for temperature intolerance    Genitourinary: positive for pelvic pain and vaginal discharge.  negative for abnormal menstrual periods, genital lesions, hot flashes, sexual problems  Integument/breast: negative for breast lump, breast tenderness, nipple discharge and skin lesion(s)    Objective:       BP 116/78   Pulse 69   Wt 219 lb (99.3 kg)   BMI 38.79 kg/m  General:   Alert and no distress  Skin:   no rash or abnormalities  Lungs:   clear to auscultation  bilaterally  Heart:   regular rate and rhythm, S1, S2 normal, no murmur, click, rub or gallop  Breasts:   normal without suspicious masses, skin or nipple changes or axillary nodes  Abdomen:  normal findings: no organomegaly, soft, non-tender and no hernia  Pelvis:  External genitalia: normal general appearance Urinary system: urethral meatus normal and bladder without fullness, nontender Vaginal: normal without tenderness, induration or masses Cervix: normal appearance Adnexa: normal bimanual exam Uterus: anteverted and non-tender, normal size   Lab Review Urine pregnancy test Labs reviewed yes Radiologic studies reviewed no  I have spent a total of 20 minutes of face-to-face time, excluding clinical staff time, reviewing notes and preparing to see patient, ordering tests and/or medications, and counseling the patient.   Assessment:    1. Encounter for gynecological examination with Papanicolaou smear of cervix Rx: - Cytology - PAP( [place]) - Comprehensive metabolic panel - TSH - Hemoglobin A1c - Ferritin - CBC  2. Vaginal discharge Rx: - Cervicovaginal ancillary only( [facility])  3. Pelvic pain in female Rx: - US PELVIC COMPLETE WITH TRANSVAGINAL; Future - ibuprofen (ADVIL) 800 MG tablet; Take 1 tablet (800 mg total) by mouth every 8 (eight) hours as needed.  Dispense: 30 tablet; Refill: 5  4. HGSIL (high grade squamous intraepithelial lesion) on Pap smear of cervix - HGSIL on colposcopic biopsies - follow up scheduled  5. Obesity (BMI 35.0-39.9 without comorbidity) - weight reduction with the aid of dietary changes, exercise and behavioral modification recommended  6. Skin pigmentation disorder Rx: - Ambulatory referral to Dermatology     Plan:    Education reviewed: calcium supplements, depression evaluation, low fat, low cholesterol diet, safe sex/STD prevention, self breast exams, and weight bearing exercise. Contraception: IUD. Follow up in: 2  weeks.   Meds ordered this encounter  Medications   ibuprofen (ADVIL) 800 MG tablet    Sig: Take 1 tablet (800 mg total) by mouth every 8 (eight) hours as needed.    Dispense:  30 tablet    Refill:  5   Orders Placed This Encounter  Procedures   US PELVIC COMPLETE WITH TRANSVAGINAL    Standing Status:   Future    Standing Expiration Date:   09/06/2022    Order Specific Question:   Reason for Exam (SYMPTOM  OR DIAGNOSIS REQUIRED)    Answer:   Pelvic pain    Order Specific Question:   Preferred imaging location?    Answer:   [facility]   Comprehensive metabolic panel   TSH   Hemoglobin A1c   Ferritin   CBC   Ambulatory referral to Dermatology    Referral Priority:   Routine    Referral Type:   Consultation    Referral Reason:   Specialty Services Required  Requested Specialty:   Dermatology    Number of Visits Requested:   1      Brock BadHarper, Krishauna Schatzman A, MD 09/05/2021 9:29 AM

## 2021-09-05 NOTE — Addendum Note (Signed)
Addended by: Lewie Loron D on: 09/05/2021 12:32 PM   Modules accepted: Orders

## 2021-09-05 NOTE — Progress Notes (Signed)
Pt has had some pain and spotting after intercourse. Pt has IUD in place. Pt states she would like upt and std screening.

## 2021-09-06 LAB — FERRITIN: Ferritin: 109 ng/mL (ref 15–150)

## 2021-09-06 LAB — COMPREHENSIVE METABOLIC PANEL
ALT: 13 IU/L (ref 0–32)
AST: 18 IU/L (ref 0–40)
Albumin/Globulin Ratio: 1.4 (ref 1.2–2.2)
Albumin: 4.3 g/dL (ref 3.9–5.0)
Alkaline Phosphatase: 76 IU/L (ref 44–121)
BUN/Creatinine Ratio: 11 (ref 9–23)
BUN: 9 mg/dL (ref 6–20)
Bilirubin Total: 0.3 mg/dL (ref 0.0–1.2)
CO2: 24 mmol/L (ref 20–29)
Calcium: 9.1 mg/dL (ref 8.7–10.2)
Chloride: 105 mmol/L (ref 96–106)
Creatinine, Ser: 0.83 mg/dL (ref 0.57–1.00)
Globulin, Total: 3 g/dL (ref 1.5–4.5)
Glucose: 97 mg/dL (ref 70–99)
Potassium: 4.8 mmol/L (ref 3.5–5.2)
Sodium: 139 mmol/L (ref 134–144)
Total Protein: 7.3 g/dL (ref 6.0–8.5)
eGFR: 99 mL/min/{1.73_m2} (ref >59)

## 2021-09-06 LAB — TSH: TSH: 2.65 u[IU]/mL (ref 0.450–4.500)

## 2021-09-06 LAB — CBC
Hematocrit: 40.6 % (ref 34.0–46.6)
Hemoglobin: 13.6 g/dL (ref 11.1–15.9)
MCH: 32.4 pg (ref 26.6–33.0)
MCHC: 33.5 g/dL (ref 31.5–35.7)
MCV: 97 fL (ref 79–97)
Platelets: 190 10*3/uL (ref 150–450)
RBC: 4.2 x10E6/uL (ref 3.77–5.28)
RDW: 13.1 % (ref 11.7–15.4)
WBC: 7.3 10*3/uL (ref 3.4–10.8)

## 2021-09-06 LAB — CERVICOVAGINAL ANCILLARY ONLY
Bacterial Vaginitis (gardnerella): POSITIVE — AB
Candida Glabrata: NEGATIVE
Candida Vaginitis: NEGATIVE
Chlamydia: NEGATIVE
Comment: NEGATIVE
Comment: NEGATIVE
Comment: NEGATIVE
Comment: NEGATIVE
Comment: NEGATIVE
Comment: NORMAL
Neisseria Gonorrhea: NEGATIVE
Trichomonas: POSITIVE — AB

## 2021-09-06 LAB — HEMOGLOBIN A1C
Est. average glucose Bld gHb Est-mCnc: 108 mg/dL
Hgb A1c MFr Bld: 5.4 % (ref 4.8–5.6)

## 2021-09-07 ENCOUNTER — Other Ambulatory Visit: Payer: Self-pay | Admitting: Obstetrics

## 2021-09-07 ENCOUNTER — Telehealth: Payer: Self-pay | Admitting: Emergency Medicine

## 2021-09-07 DIAGNOSIS — B9689 Other specified bacterial agents as the cause of diseases classified elsewhere: Secondary | ICD-10-CM

## 2021-09-07 MED ORDER — METRONIDAZOLE 500 MG PO TABS: 500.0000 mg | ORAL_TABLET | Freq: Two times a day (BID) | ORAL | 2 refills | Status: AC

## 2021-09-07 NOTE — Telephone Encounter (Signed)
Attempted TC to patient to disuss Rx and results. Unable to leave voicemail. No Mychart set up.

## 2021-09-11 LAB — CYTOLOGY - PAP
Comment: NEGATIVE
Comment: NEGATIVE
Comment: NEGATIVE
Diagnosis: HIGH — AB
HPV 16: NEGATIVE
HPV 18 / 45: NEGATIVE
High risk HPV: POSITIVE — AB

## 2021-09-13 ENCOUNTER — Ambulatory Visit (HOSPITAL_COMMUNITY)
Admission: RE | Admit: 2021-09-13 | Discharge: 2021-09-13 | Disposition: A | Discharge status: Home / Self Care | Payer: Medicaid Other | Source: Ambulatory Visit | Attending: Obstetrics | Admitting: Obstetrics

## 2021-09-13 DIAGNOSIS — R102 Pelvic and perineal pain: Secondary | ICD-10-CM | POA: Insufficient documentation

## 2021-09-17 ENCOUNTER — Telehealth: Payer: Self-pay | Admitting: Emergency Medicine

## 2021-09-17 NOTE — Telephone Encounter (Signed)
TC from pt asking about results and upcoming apt.

## 2021-10-01 ENCOUNTER — Ambulatory Visit: Payer: Medicaid Other | Admitting: Obstetrics and Gynecology

## 2021-10-15 ENCOUNTER — Ambulatory Visit: Payer: Medicaid Other | Admitting: Obstetrics and Gynecology

## 2021-12-11 ENCOUNTER — Ambulatory Visit: Payer: Medicaid Other | Admitting: Obstetrics and Gynecology

## 2021-12-14 ENCOUNTER — Other Ambulatory Visit (HOSPITAL_COMMUNITY)
Admission: RE | Admit: 2021-12-14 | Discharge: 2021-12-14 | Disposition: A | Discharge status: Home / Self Care | Payer: Medicaid Other | Source: Ambulatory Visit | Attending: Obstetrics and Gynecology | Admitting: Obstetrics and Gynecology

## 2021-12-14 ENCOUNTER — Ambulatory Visit (INDEPENDENT_AMBULATORY_CARE_PROVIDER_SITE_OTHER): Payer: Medicaid Other | Admitting: Obstetrics and Gynecology

## 2021-12-14 ENCOUNTER — Encounter: Payer: Self-pay | Admitting: Obstetrics and Gynecology

## 2021-12-14 VITALS — BP 110/77 | HR 75 | Wt 214.0 lb

## 2021-12-14 DIAGNOSIS — Z01812 Encounter for preprocedural laboratory examination: Secondary | ICD-10-CM | POA: Diagnosis not present

## 2021-12-14 DIAGNOSIS — Z113 Encounter for screening for infections with a predominantly sexual mode of transmission: Secondary | ICD-10-CM

## 2021-12-14 DIAGNOSIS — R87613 High grade squamous intraepithelial lesion on cytologic smear of cervix (HGSIL): Secondary | ICD-10-CM | POA: Insufficient documentation

## 2021-12-14 DIAGNOSIS — Z975 Presence of (intrauterine) contraceptive device: Secondary | ICD-10-CM

## 2021-12-14 LAB — POCT URINE PREGNANCY: Preg Test, Ur: NEGATIVE

## 2021-12-14 NOTE — Progress Notes (Signed)
Pt presents for colposcopy bx s/p ASC-H found on pap smear 09/05/21.  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

## 2021-12-14 NOTE — Progress Notes (Signed)
Patient given informed consent, signed copy in the chart, time out was performed.  UPT negative.  Pathology reviewed  12/21 HGSIL 09/01/20, colpo with CIN 2, pt lost to follow up 11/22: pt scheduled for LEEP, colpo had no nonstaining areas with Lugol's 08/16/21: ASCUS cannot r/o HGSIL   Placed in lithotomy position. Cervix viewed with speculum and colposcope after application of acetic acid.   Colposcopy adequate?  no Acetowhite lesions? none external, negative for lugol's as well  Punctation?none Mosaicism?  none Abnormal vasculature?  none Biopsies?none ECC?  Sample taken  COMMENTS: Patient was given post procedure instructions.  Will follow closely.  Pt may need yearly paps for future surveillance, but no obvious cervical changes were noted today.  Warden Fillers, MD

## 2021-12-18 LAB — SURGICAL PATHOLOGY

## 2021-12-19 LAB — CERVICOVAGINAL ANCILLARY ONLY
Chlamydia: NEGATIVE
Comment: NEGATIVE
Comment: NEGATIVE
Comment: NORMAL
Neisseria Gonorrhea: NEGATIVE
Trichomonas: NEGATIVE

## 2021-12-19 NOTE — Progress Notes (Signed)
Pt advised of ECC results and need for repeat Pap in 1 year. All questions answered. Pt verbalized understanding.

## 2022-09-12 ENCOUNTER — Ambulatory Visit: Payer: Medicaid Other | Admitting: Dermatology
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
## 2022-10-24 IMAGING — US US MFM OB FOLLOW-UP
1 series · 14 of 28 positions shown · non-contrast
Comparison: none

[Series 1: us mfm ob follow-up · 113 acquisitions, 14 frames shown]
[im 5/113]
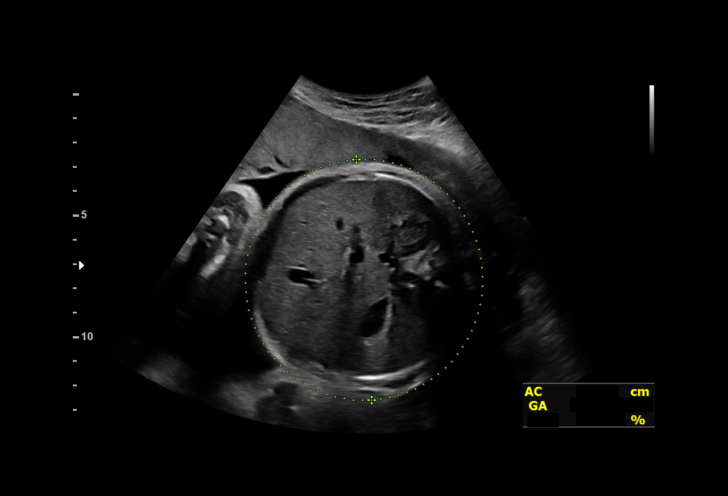
[im 13/113]
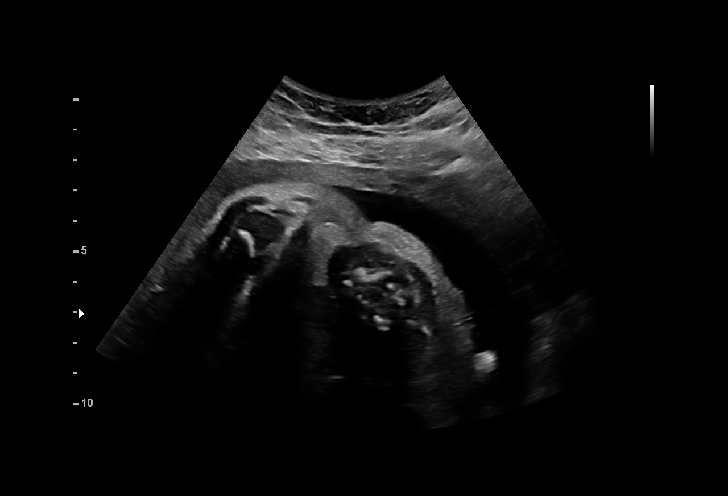
[im 21/113]
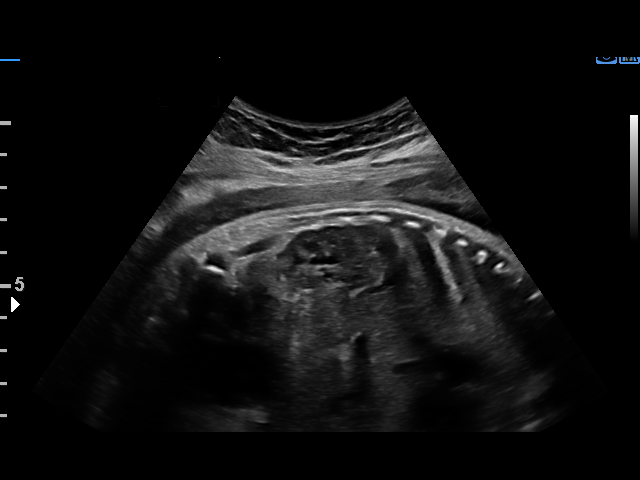
[im 30/113]
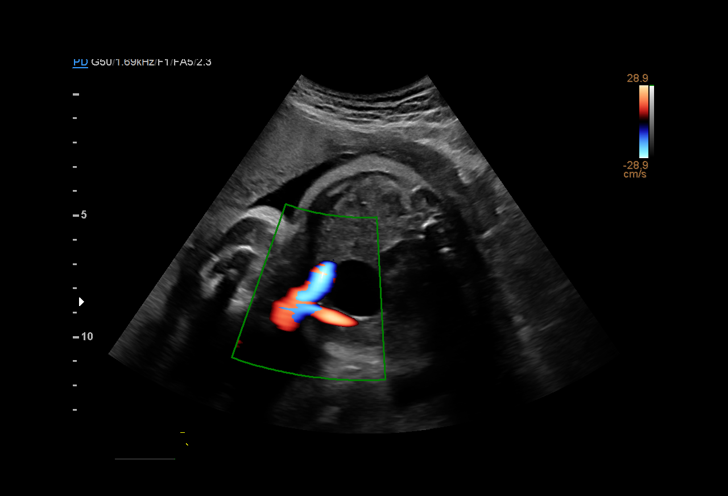
[im 38/113]
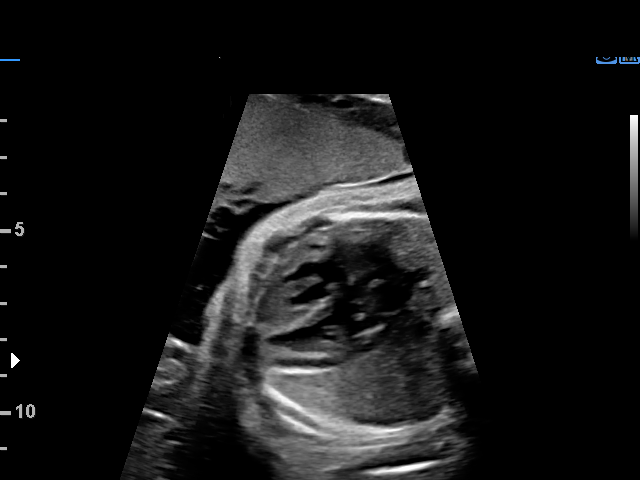
[im 46/113]
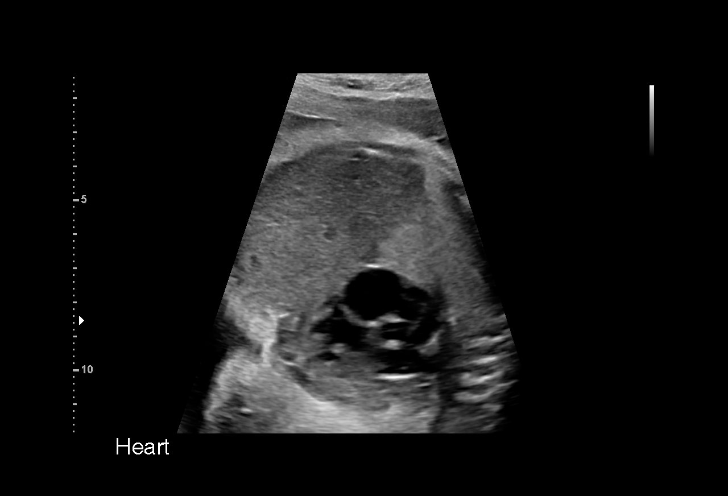
[im 54/113]
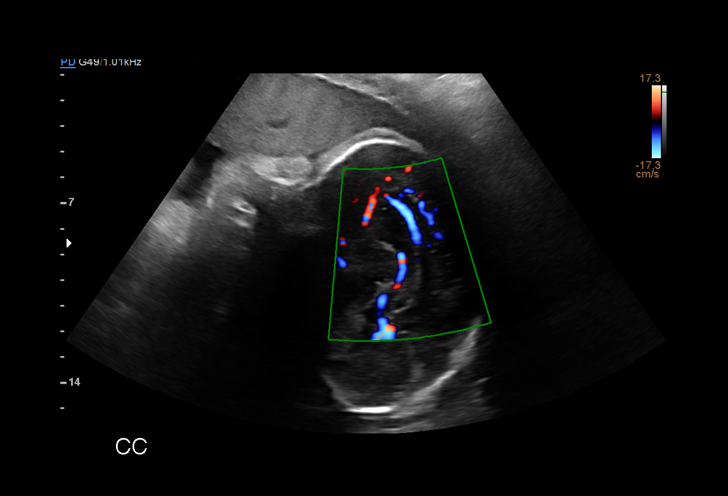
[im 63/113]
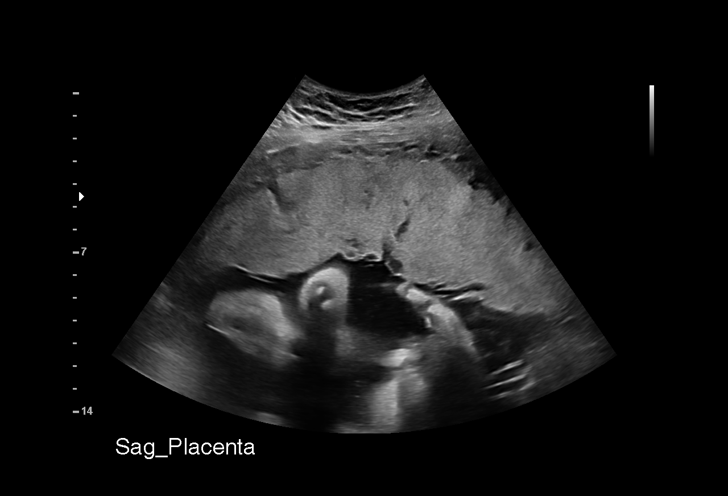
[im 71/113]
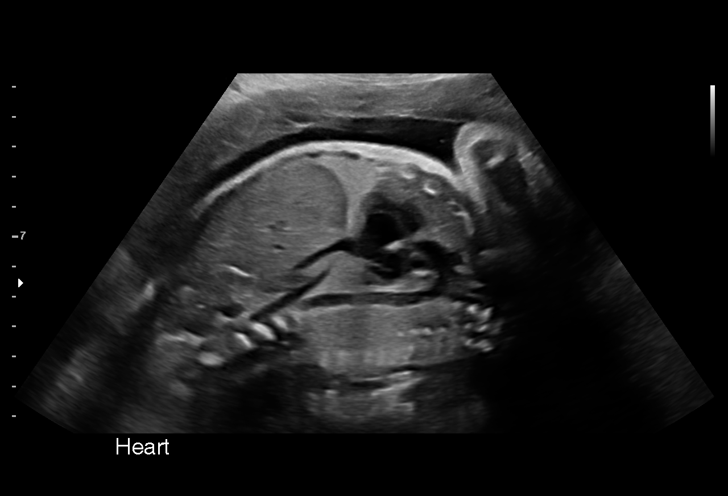
[im 79/113]
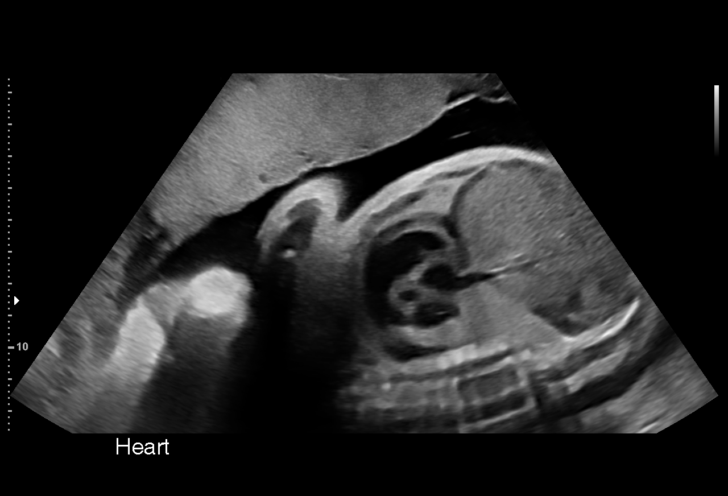
[im 88/113]
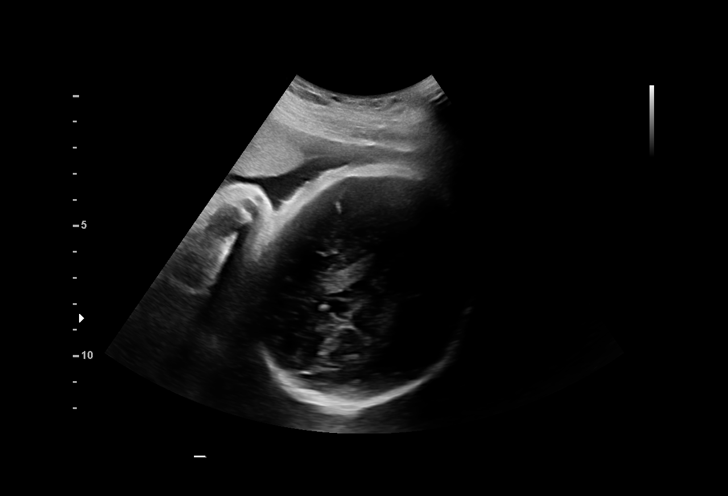
[im 96/113]
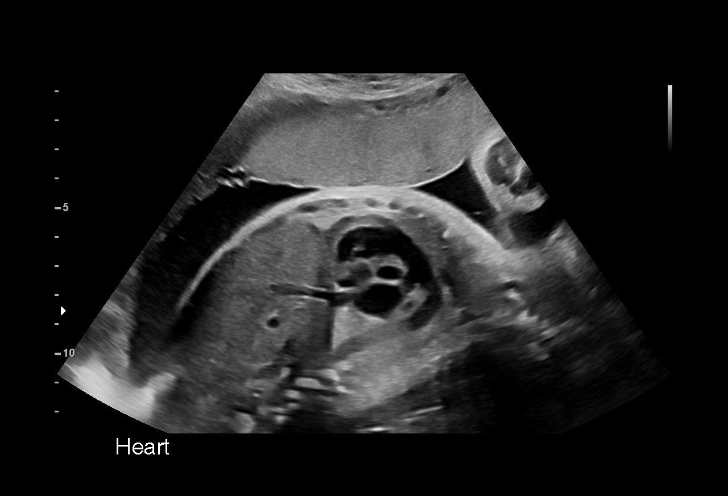
[im 104/113]
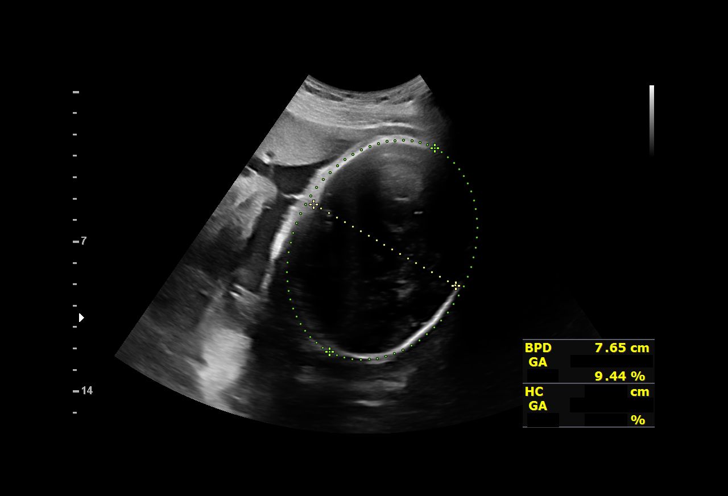
[im 113/113]
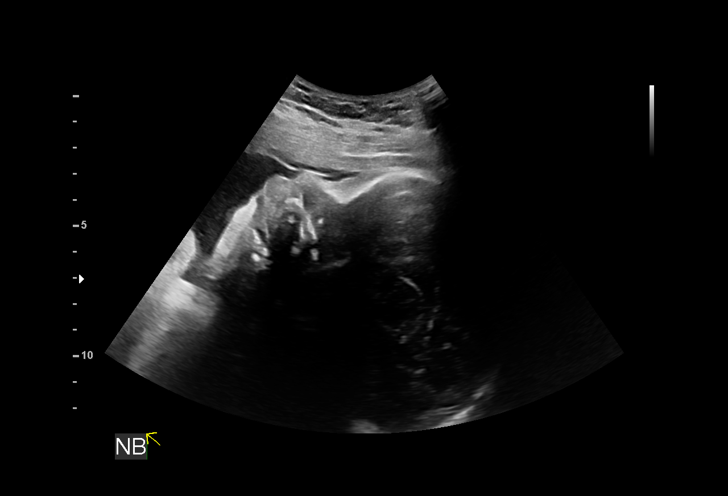

[14 of 28 positions shown; findings below may reference images not displayed]

CNM

Indications

 Obesity complicating pregnancy, third
 trimester (Pregravid BMI 30)
 Late to prenatal care, third trimester
 Poor obstetric history (prior pre-term labor)
 Encounter for other antenatal screening
 follow-up
 Low Risk NIPS(Negative Horizon)(Negative
 AFP)
 History of cesarean delivery, currently
 pregnant
 32 weeks gestation of pregnancy
Vital Signs

 BMI:
Fetal Evaluation

 Num Of Fetuses:         1
 Fetal Heart Rate(bpm):  143
 Cardiac Activity:       Observed
 Presentation:           Cephalic
 Placenta:               Anterior
 P. Cord Insertion:      Previously Visualized

 Amniotic Fluid
 AFI FV:      Within normal limits

 AFI Sum(cm)     %Tile       Largest Pocket(cm)
 15.69           56          6.
 RUQ(cm)       RLQ(cm)       LUQ(cm)        LLQ(cm)
 3.88          2.31          3.5            6
Biometry

 BPD:      77.7  mm     G. Age:  31w 1d         18  %    CI:        67.84   %    70 - 86
                                                         FL/HC:      20.4   %    19.1 -
 HC:      301.8  mm     G. Age:  33w 4d         54  %    HC/AC:      0.98        0.96 -
 AC:       309   mm     G. Age:  34w 6d         98  %    FL/BPD:     79.3   %    71 - 87
 FL:       61.6  mm     G. Age:  32w 0d         35  %    FL/AC:      19.9   %    20 - 24

 Est. FW:    6664  gm    4 lb 14 oz      86  %
OB History

 Gravidity:    5
 Living:       4
Gestational Age

 U/S Today:     32w 6d                                        EDD:   06/23/20
 Best:          32w 0d     Det. By:  Previous Ultrasound      EDD:   06/29/20
Anatomy

 Cranium:               Appears normal         LVOT:                   Appears normal
 Cavum:                 Appears normal         Aortic Arch:            Appears normal
 Ventricles:            Appears normal         Ductal Arch:            Appears normal
 Choroid Plexus:        Appears normal         Diaphragm:              Appears normal
 Cerebellum:            Previously seen        Stomach:                Appears normal, left
                                                                       sided
 Posterior Fossa:       Previously seen        Abdomen:                Appears normal
 Nuchal Fold:           Not applicable (>20    Abdominal Wall:         Previously seen
                        wks GA)
 Face:                  Appears normal         Cord Vessels:           Previously seen
                        (orbits and profile)
 Lips:                  Previously seen        Kidneys:                Appear normal
 Palate:                Not well visualized    Bladder:                Appears normal
 Thoracic:              Appears normal         Spine:                  Appears normal
 Heart:                 Appears normal         Upper Extremities:      Previously seen
                        (4CH, axis, and
                        situs)
 RVOT:                  Appears normal         Lower Extremities:      Previously seen

 Other:  Hands, feet, Open hands,and Heels previously visualized. Fetus
         appears to be female.
Cervix Uterus Adnexa

 Cervix
 Not visualized (advanced GA >76wks)
Impression

 Late prenatal care. Patient returned for completion of fetal
 anatomy scan and growith.

 She does not have gestational diabetes.  Blood pressure
 today at her office is 107/66 mmHg.

 Amniotic fluid is normal and good fetal activity is seen .Fetal
 anatomical survey was completed and appears normal.
 We reassured the patient of the findings.
Recommendations

 Follow-up scans as clinically indicated.
                 Viosz, Lien Peggi

## 2023-09-12 ENCOUNTER — Ambulatory Visit: Admitting: Physician Assistant

## 2023-09-12 ENCOUNTER — Other Ambulatory Visit (HOSPITAL_COMMUNITY)
Admission: RE | Admit: 2023-09-12 | Discharge: 2023-09-12 | Disposition: A | Discharge status: Home / Self Care | Source: Ambulatory Visit | Attending: Physician Assistant | Admitting: Physician Assistant

## 2023-09-12 ENCOUNTER — Encounter: Payer: Self-pay | Admitting: Physician Assistant

## 2023-09-12 VITALS — BP 118/76 | HR 80 | Wt 222.8 lb

## 2023-09-12 DIAGNOSIS — Z113 Encounter for screening for infections with a predominantly sexual mode of transmission: Secondary | ICD-10-CM

## 2023-09-12 DIAGNOSIS — Z975 Presence of (intrauterine) contraceptive device: Secondary | ICD-10-CM | POA: Diagnosis not present

## 2023-09-12 DIAGNOSIS — Z124 Encounter for screening for malignant neoplasm of cervix: Secondary | ICD-10-CM | POA: Diagnosis not present

## 2023-09-12 DIAGNOSIS — K5900 Constipation, unspecified: Secondary | ICD-10-CM | POA: Diagnosis not present

## 2023-09-12 NOTE — Progress Notes (Signed)
 Pt is concerned of cramping, thinking it may be her IUD, though pt states she has had constipation for 3 months now and has not taken anything for it.   No other concerns at this time.

## 2023-09-12 NOTE — Patient Instructions (Addendum)
Miralax, Dulcolax

## 2023-09-12 NOTE — Progress Notes (Signed)
 GYNECOLOGY  VISIT   HPI: Denise Fleming is a 28 y.o.   Single female [phone] here for uterine cramping, would like to double check that IUD is in place. Also having constipation x3 months. Has not taken any medication for either of these issues.   GYNECOLOGIC HISTORY: No LMP recorded. (Menstrual status: IUD). Contraception: Liletta  IUD 06/23/2020 Menopausal hormone therapy:  premenopausal Last mammogram:  never done due to age Last pap smear:  Diagnosis  Date Value Ref Range Status  09/05/2021 (A)  Final   - Atypical squamous cells, cannot exclude high grade squamous  09/05/2021 intraepithelial lesion (ASC-H) (A)  Final           OB History     Gravida  5   Para  5   Term  4   Preterm  1   AB  0   Living  5      SAB      IAB      Ectopic      Multiple  0   Live Births  5        Obstetric Comments  1st baby "premature"-[place], [place] 3rd baby C/S-[place]             Patient Active Problem List   Diagnosis Date Noted   Screening for STD (sexually transmitted disease) 02/22/2021   Dysplasia of cervix, high grade CIN 2 02/22/2021   Presence of 52 mg levonorgestrel -releasing intrauterine device (IUD) 02/22/2021   Rash and nonspecific skin eruption 09/01/2020   Status post cesarean delivery 06/23/2020   Supervision of normal pregnancy 06/23/2020   Encounter for insertion of intrauterine contraceptive device (IUD)    [redacted] weeks gestation of pregnancy 06/21/2020   GBS (group B Streptococcus carrier), +RV culture, currently pregnant 06/12/2020   Hemorrhoids 05/31/2020   History of shoulder dystocia in prior pregnancy 05/26/2020   HSIL (high grade squamous intraepithelial lesion) on Pap smear of cervix 05/26/2020   Pelvic pain in pregnancy 05/26/2020   Uterine size-date discrepancy in third trimester 05/26/2020   Constipation during pregnancy in third trimester 05/26/2020   Obesity in pregnancy, antepartum 03/22/2020   Thyroid enlargement  03/22/2020   History of C-section 03/22/2020   Red blood cell antibody positive 03/18/2020   Supervision of other normal pregnancy, antepartum 02/28/2020   Late prenatal care affecting pregnancy, antepartum 02/28/2020    Past Medical History:  Diagnosis Date   Medical history non-contributory     Past Surgical History:  Procedure Laterality Date   CESAREAN SECTION     CESAREAN SECTION N/A 06/23/2020   Procedure: CESAREAN SECTION;  Surgeon: Granville Layer, MD;  Location: [facility];  Service: Obstetrics;  Laterality: N/A;    Current Outpatient Medications  Medication Sig Dispense Refill   docusate sodium  (COLACE) 100 MG capsule Take 1 capsule (100 mg total) by mouth 2 (two) times daily. (Patient not taking: Reported on 06/21/2020) 60 capsule 1   ibuprofen  (ADVIL ) 800 MG tablet Take 1 tablet (800 mg total) by mouth every 6 (six) hours as needed. (Patient not taking: Reported on 12/14/2021) 30 tablet 0   ibuprofen  (ADVIL ) 800 MG tablet Take 1 tablet (800 mg total) by mouth every 8 (eight) hours as needed. (Patient not taking: Reported on 12/14/2021) 30 tablet 5   levonorgestrel  (LILETTA , 52 MG,) 20.1 MCG/DAY IUD IUD 1 each by Intrauterine route once.     metroNIDAZOLE  (FLAGYL ) 500 MG tablet Take 1 tablet (500 mg total) by mouth 2 (two) times daily. (  Patient not taking: Reported on 12/14/2021) 14 tablet 2   Prenatal 27-1 MG TABS Take 1 tablet by mouth daily. (Patient not taking: Reported on 12/14/2021) 30 tablet 12   No current facility-administered medications for this visit.     ALLERGIES: Patient has no known allergies.  Family History  Problem Relation Age of Onset   Healthy Mother     Social History   Socioeconomic History   Marital status: Single    Spouse name: Not on file   Number of children: 4   Years of education: Not on file   Highest education level: Not on file  Occupational History   Not on file  Tobacco Use   Smoking status: Never    Passive exposure: Never    Smokeless tobacco: Never  Vaping Use   Vaping status: Never Used  Substance and Sexual Activity   Alcohol use: Never   Drug use: Never   Sexual activity: Yes    Birth control/protection: I.U.D.  Other Topics Concern   Not on file  Social History Narrative   [place]rived in [place]5 [place].    Social Drivers of Corporate investment banker Strain: Not on file  Food Insecurity: Not on file  Transportation Needs: Not on file  Physical Activity: Not on file  Stress: Not on file  Social Connections: Not on file  Intimate Partner Violence: Not on file    Review of Systems  PHYSICAL EXAMINATION:    There were no vitals taken for this visit.    General appearance: alert, cooperative and appears stated age Head: Normocephalic, without obvious abnormality, atraumatic Neck: no adenopathy, supple, symmetrical, trachea midline and thyroid normal to inspection and palpation Lungs: clear to auscultation bilaterally Abdomen: soft, non-tender, no masses,  no organomegaly Extremities: extremities normal, atraumatic, no cyanosis or edema Skin: Skin color, texture, turgor normal. No rashes or lesions Lymph nodes: Cervical, supraclavicular, and axillary nodes normal. No abnormal inguinal nodes palpated Neurologic: Grossly normal  Pelvic: External genitalia:  no lesions              Urethra:  normal appearing urethra with no masses, tenderness or lesions              Bartholins and Skenes: normal                 Vagina: normal appearing vagina with normal color and discharge, no lesions              Cervix: IUD strings visible and appropriate length. no lesions                Chaperone was present for exam  ASSESSMENT & PLAN   1. Routine screening for STI (sexually transmitted infection) (Primary) - Cervicovaginal ancillary only( [place]) - Hepatitis B Surface AntiGEN - Hepatitis C Antibody - HIV antibody (with reflex) - RPR  2. Cervical cancer screening - Cytology - PAP( [facility])  3. IUD (intrauterine device) in place - Strings visible and appropriate length on exam - May take NSAIDs in the meantime for acute pain from cramping - US  PELVIC COMPLETE WITH TRANSVAGINAL; Future   4. Constipation Discussed lifestyle modifications and OTC therapies. Also attached in AVS.   An After Visit Summary was printed and given to the patient.  Tevis Dunavan E Anuar Walgren, PA-C 5/30/20257:43 AM

## 2023-09-13 LAB — RPR: RPR Ser Ql: NONREACTIVE

## 2023-09-13 LAB — HEPATITIS C ANTIBODY: Hep C Virus Ab: NONREACTIVE

## 2023-09-13 LAB — HIV ANTIBODY (ROUTINE TESTING W REFLEX): HIV Screen 4th Generation wRfx: NONREACTIVE

## 2023-09-13 LAB — HEPATITIS B SURFACE ANTIGEN: Hepatitis B Surface Ag: NEGATIVE

## 2023-09-15 LAB — CERVICOVAGINAL ANCILLARY ONLY
Bacterial Vaginitis (gardnerella): POSITIVE — AB
Candida Glabrata: NEGATIVE
Candida Vaginitis: POSITIVE — AB
Chlamydia: NEGATIVE
Comment: NEGATIVE
Comment: NEGATIVE
Comment: NEGATIVE
Comment: NEGATIVE
Comment: NEGATIVE
Comment: NORMAL
Neisseria Gonorrhea: NEGATIVE
Trichomonas: POSITIVE — AB

## 2023-09-16 ENCOUNTER — Ambulatory Visit: Payer: Self-pay

## 2023-09-16 DIAGNOSIS — B379 Candidiasis, unspecified: Secondary | ICD-10-CM

## 2023-09-16 DIAGNOSIS — A599 Trichomoniasis, unspecified: Secondary | ICD-10-CM

## 2023-09-16 DIAGNOSIS — B9689 Other specified bacterial agents as the cause of diseases classified elsewhere: Secondary | ICD-10-CM

## 2023-09-16 MED ORDER — METRONIDAZOLE 500 MG PO TABS: 500.0000 mg | ORAL_TABLET | Freq: Two times a day (BID) | ORAL | 0 refills | Status: AC

## 2023-09-16 MED ORDER — FLUCONAZOLE 150 MG PO TABS: 150.0000 mg | ORAL_TABLET | Freq: Once | ORAL | 0 refills | Status: AC

## 2023-09-18 ENCOUNTER — Ambulatory Visit (HOSPITAL_BASED_OUTPATIENT_CLINIC_OR_DEPARTMENT_OTHER): Admission: RE | Admit: 2023-09-18 | Source: Ambulatory Visit

## 2023-09-19 LAB — CYTOLOGY - PAP: Adequacy: ABNORMAL

## 2024-03-15 ENCOUNTER — Emergency Department (HOSPITAL_COMMUNITY)
Admission: EM | Admit: 2024-03-15 | Discharge: 2024-03-15 | Disposition: A | Discharge status: Home / Self Care | Attending: Emergency Medicine | Admitting: Emergency Medicine

## 2024-03-15 ENCOUNTER — Emergency Department (HOSPITAL_COMMUNITY)

## 2024-03-15 ENCOUNTER — Encounter (HOSPITAL_COMMUNITY): Payer: Self-pay

## 2024-03-15 DIAGNOSIS — R059 Cough, unspecified: Secondary | ICD-10-CM | POA: Insufficient documentation

## 2024-03-15 DIAGNOSIS — K2100 Gastro-esophageal reflux disease with esophagitis, without bleeding: Secondary | ICD-10-CM | POA: Insufficient documentation

## 2024-03-15 DIAGNOSIS — R079 Chest pain, unspecified: Secondary | ICD-10-CM | POA: Diagnosis present

## 2024-03-15 LAB — BASIC METABOLIC PANEL WITH GFR
Anion gap: 10 (ref 5–15)
BUN: 11 mg/dL (ref 6–20)
CO2: 24 mmol/L (ref 22–32)
Calcium: 9 mg/dL (ref 8.9–10.3)
Chloride: 102 mmol/L (ref 98–111)
Creatinine, Ser: 0.71 mg/dL (ref 0.44–1.00)
GFR, Estimated: >60 mL/min (ref >60)
Glucose, Bld: 97 mg/dL (ref 70–99)
Potassium: 4.7 mmol/L (ref 3.5–5.1)
Sodium: 136 mmol/L (ref 135–145)

## 2024-03-15 LAB — HCG, SERUM, QUALITATIVE: Preg, Serum: NEGATIVE

## 2024-03-15 LAB — RESP PANEL BY RT-PCR (RSV, FLU A&B, COVID)  RVPGX2
Influenza A by PCR: NEGATIVE
Influenza B by PCR: NEGATIVE
Resp Syncytial Virus by PCR: NEGATIVE
SARS Coronavirus 2 by RT PCR: NEGATIVE

## 2024-03-15 LAB — HEPATIC FUNCTION PANEL
ALT: 15 U/L (ref 0–44)
AST: 21 U/L (ref 15–41)
Albumin: 2.7 g/dL — ABNORMAL LOW (ref 3.5–5.0)
Alkaline Phosphatase: 48 U/L (ref 38–126)
Bilirubin, Direct: 0.4 mg/dL — ABNORMAL HIGH (ref 0.0–0.2)
Indirect Bilirubin: 0.3 mg/dL (ref 0.3–0.9)
Total Bilirubin: 0.7 mg/dL (ref 0.0–1.2)
Total Protein: 5.6 g/dL — ABNORMAL LOW (ref 6.5–8.1)

## 2024-03-15 LAB — CBC
HCT: 41.8 % (ref 36.0–46.0)
Hemoglobin: 14.3 g/dL (ref 12.0–15.0)
MCH: 32.7 pg (ref 26.0–34.0)
MCHC: 34.2 g/dL (ref 30.0–36.0)
MCV: 95.7 fL (ref 80.0–100.0)
Platelets: 195 K/uL (ref 150–400)
RBC: 4.37 MIL/uL (ref 3.87–5.11)
RDW: 12.9 % (ref 11.5–15.5)
WBC: 6.7 K/uL (ref 4.0–10.5)
nRBC: 0 % (ref 0.0–0.2)

## 2024-03-15 LAB — LIPASE, BLOOD: Lipase: 28 U/L (ref 11–51)

## 2024-03-15 LAB — TROPONIN I (HIGH SENSITIVITY): Troponin I (High Sensitivity): <2 ng/L (ref <18)

## 2024-03-15 MED ORDER — SODIUM CHLORIDE 0.9 % IV BOLUS
1000.0000 mL | Freq: Once | INTRAVENOUS | Status: AC
  Administered 2024-03-15: 1000 mL via INTRAVENOUS

## 2024-03-15 MED ORDER — PANTOPRAZOLE SODIUM 40 MG IV SOLR
40.0000 mg | Freq: Once | INTRAVENOUS | Status: AC
  Administered 2024-03-15: 40 mg via INTRAVENOUS
  Filled 2024-03-15: qty 10

## 2024-03-15 MED ORDER — ALUM & MAG HYDROXIDE-SIMETH 200-200-20 MG/5ML PO SUSP
30.0000 mL | Freq: Once | ORAL | Status: AC
  Administered 2024-03-15: 30 mL via ORAL
  Filled 2024-03-15: qty 30

## 2024-03-15 MED ORDER — PANTOPRAZOLE SODIUM 40 MG PO TBEC: 40.0000 mg | DELAYED_RELEASE_TABLET | Freq: Every day | ORAL | 0 refills | Status: AC

## 2024-03-15 MED ORDER — SUCRALFATE 1 G PO TABS: 1.0000 g | ORAL_TABLET | Freq: Three times a day (TID) | ORAL | 0 refills | Status: AC

## 2024-03-15 NOTE — ED Notes (Signed)
 Light green recollected and sent to lab

## 2024-03-15 NOTE — Discharge Instructions (Signed)
 We are placing you on 2 different medicines to help with your esophageal and stomach acid levels.  For now do not take NSAIDs such as ibuprofen , Advil , etc.  Tylenol  is still okay.  Do not smoke or drink alcohol.  Follow-up with your primary care provider.  If you develop new or worsening symptoms, including blood in your vomit, bloody or black stool, or any other new/concerning symptoms then return to the ER.

## 2024-03-15 NOTE — ED Notes (Signed)
 Pt eating her own food and drink given as requested by provider. No signs of nausea or vomiting at this time.
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
## 2024-03-15 NOTE — ED Provider Notes (Signed)
 [place] EMERGENCY DEPARTMENT AT [facility] Provider Note   CSN: [id number] Arrival date & time: 03/15/24  1629     Patient presents with: Chest Pain and Emesis   Denise Fleming is a 28 y.o. female.   HPI 28 year old female presents with chest pain and vomiting.  The patient states that she has had chest pain for a while but starting on Thanksgiving she started having more intense chest pain and mid upper back pain.  She has been having multiple episodes of emesis per day and each time there seems to be a small amount of blood.  She denies any abdominal pain.  Some shortness of breath.  Maybe a little bit of a cough.  No leg swelling.  She has not tried anything for the pain.  Feels like things get stuck when she tries to swallow. Her chest is sore when she touches it.  Prior to Admission medications   Medication Sig Start Date End Date Taking? Authorizing Provider  pantoprazole  (PROTONIX ) 40 MG tablet Take 1 tablet (40 mg total) by mouth daily. 03/15/24  Yes Freddi Hamilton, MD  sucralfate (CARAFATE) 1 g tablet Take 1 tablet (1 g total) by mouth 4 (four) times daily -  with meals and at bedtime. 03/15/24 04/14/24 Yes Freddi Hamilton, MD  docusate sodium  (COLACE) 100 MG capsule Take 1 capsule (100 mg total) by mouth 2 (two) times daily. Patient not taking: Reported on 06/21/2020 05/26/20   Nugent, Nat BRAVO, NP  ibuprofen  (ADVIL ) 800 MG tablet Take 1 tablet (800 mg total) by mouth every 6 (six) hours as needed. Patient not taking: Reported on 12/14/2021 06/25/20   Synthia Raisin, CNM  ibuprofen  (ADVIL ) 800 MG tablet Take 1 tablet (800 mg total) by mouth every 8 (eight) hours as needed. Patient not taking: Reported on 12/14/2021 09/05/21   Rudy Carlin LABOR, MD  levonorgestrel  (LILETTA , 52 MG,) 20.1 MCG/DAY IUD IUD 1 each by Intrauterine route once. 06/23/20   [provider]  metroNIDAZOLE  (FLAGYL ) 500 MG tablet Take 1 tablet (500 mg total) by mouth 2 (two) times  daily. Patient not taking: Reported on 12/14/2021 09/07/21   Rudy Carlin LABOR, MD  Prenatal 27-1 MG TABS Take 1 tablet by mouth daily. Patient not taking: Reported on 12/14/2021 04/11/20   Wenzel, Julie N, PA-C    Allergies: Patient has no known allergies.    Review of Systems  Constitutional:  Negative for fever.  Respiratory:  Positive for cough and shortness of breath.   Cardiovascular:  Positive for chest pain. Negative for leg swelling.  Gastrointestinal:  Negative for abdominal pain.  Musculoskeletal:  Positive for back pain.    Updated Vital Signs BP 104/79 (BP Location: Right Arm)   Pulse 83   Temp 98.5 F (36.9 C) (Oral)   Resp 19   Ht 5' 3 (1.6 m)   Wt 101 kg   SpO2 100%   Breastfeeding No   BMI 39.44 kg/m   Physical Exam Vitals and nursing note reviewed.  Constitutional:      General: She is not in acute distress.    Appearance: She is well-developed. She is not ill-appearing or diaphoretic.  HENT:     Head: Normocephalic and atraumatic.  Cardiovascular:     Rate and Rhythm: Normal rate and regular rhythm.     Heart sounds: Normal heart sounds.  Pulmonary:     Effort: Pulmonary effort is normal.     Breath sounds: Normal breath sounds.  Abdominal:  Palpations: Abdomen is soft.     Tenderness: There is no abdominal tenderness.  Musculoskeletal:     Thoracic back: Tenderness present.       Back:  Skin:    General: Skin is warm and dry.  Neurological:     Mental Status: She is alert.     (all labs ordered are listed, but only abnormal results are displayed) Labs Reviewed  HEPATIC FUNCTION PANEL - Abnormal; Notable for the following components:      Result Value   Total Protein 5.6 (*)    Albumin 2.7 (*)    Bilirubin, Direct 0.4 (*)    All other components within normal limits  RESP PANEL BY RT-PCR (RSV, FLU A&B, COVID)  [id number]  BASIC METABOLIC PANEL WITH GFR  CBC  HCG, SERUM, QUALITATIVE  LIPASE, BLOOD  TROPONIN I (HIGH SENSITIVITY)     EKG: EKG Interpretation Date/Time:  Monday March 15 2024 16:39:04 EST Ventricular Rate:  83 PR Interval:  202 QRS Duration:  86 QT Interval:  368 QTC Calculation: 432 R Axis:   4  Text Interpretation: Normal sinus rhythm Anterolateral infarct , age undetermined no acute ST/T changes HR improved, but otherwise similar to 2021 Confirmed by Freddi Hamilton [id number]) on 03/15/2024 5:23:11 PM  Radiology: DG Chest 2 View Result Date: 03/15/2024 CLINICAL DATA:  chest pain EXAM: CHEST - 2 VIEW COMPARISON:  February 19, 2021 FINDINGS: No focal airspace consolidation, pleural effusion, or pneumothorax. No cardiomegaly.No acute fracture or destructive lesion. IMPRESSION: No acute cardiopulmonary abnormality. Electronically Signed   By: Rogelia Myers M.D.   On: 03/15/2024 17:30     Procedures   Medications Ordered in the ED  sodium chloride  0.9 % bolus 1,000 mL (1,000 mLs Intravenous New Bag/Given 03/15/24 1757)  alum & mag hydroxide-simeth (MAALOX/MYLANTA) 200-200-20 MG/5ML suspension 30 mL (30 mLs Oral Given 03/15/24 1758)  pantoprazole  (PROTONIX ) injection 40 mg (40 mg Intravenous Given 03/15/24 1757)                                    Medical Decision Making Amount and/or Complexity of Data Reviewed Labs: ordered.    Details: Normal troponin Radiology: ordered and independent interpretation performed.    Details: No pneumonia/CHF ECG/medicine tests: ordered and independent interpretation performed.    Details: No ischemia  Risk OTC drugs. Prescription drug management.   Patient presentation sounds like she is having vomiting with some esophagitis/gastritis.  She was given Maalox and Protonix  and her symptoms of significantly improved.  She is tolerating oral fluids.  She has been having some blood in her emesis but it sounds like this is more Mallory-Weiss level rather than true hematemesis.  She is also been having vomiting for several days and has a normal hemoglobin.  She  has not noticed any bloody or black stools.  I do not think her chest pain is related to cardiac disease given completely normal troponin, benign ECG and symptoms much more consistent with GERD.  I had initially added a D-dimer as she told me she was on birth control but it seems on chart review that she is not on estrogen-containing birth control so I do not think she is at significant risk for PE based on this and is otherwise PERC negative.  She is feeling better and I think stable for discharge home with return precautions.  While a normal BUN would not specifically rule out upper GI  bleed and her BUN is normal and I think GI bleeding is pretty unlikely.  Will refer to GI based on her symptoms and also recommend she follow-up with her PCP.      Final diagnoses:  Gastroesophageal reflux disease with esophagitis, unspecified whether hemorrhage    ED Discharge Orders          Ordered    pantoprazole  (PROTONIX ) 40 MG tablet  Daily        03/15/24 2004    sucralfate (CARAFATE) 1 g tablet  3 times daily with meals & bedtime        03/15/24 2004    Ambulatory referral to Gastroenterology        03/15/24 2004               Freddi Hamilton, MD 03/15/24 2022

## 2024-03-16 ENCOUNTER — Telehealth: Payer: Self-pay

## 2024-03-23 ENCOUNTER — Encounter: Payer: Self-pay | Admitting: Internal Medicine
